# Patient Record
Sex: Female | Born: 1960 | Race: Black or African American | Hispanic: No | Smoking: Never smoker
Health system: Southern US, Community
[De-identification: ages and names within clinical notes are randomized; demographics above are authoritative.]

## PROBLEM LIST (undated history)

## (undated) DIAGNOSIS — E785 Hyperlipidemia, unspecified: Secondary | ICD-10-CM

## (undated) DIAGNOSIS — J45909 Unspecified asthma, uncomplicated: Secondary | ICD-10-CM

## (undated) DIAGNOSIS — E119 Type 2 diabetes mellitus without complications: Secondary | ICD-10-CM

## (undated) DIAGNOSIS — I10 Essential (primary) hypertension: Secondary | ICD-10-CM

## (undated) DIAGNOSIS — K759 Inflammatory liver disease, unspecified: Secondary | ICD-10-CM

## (undated) HISTORY — PX: DILATION AND CURETTAGE OF UTERUS: SHX78

---

## 1998-05-28 ENCOUNTER — Other Ambulatory Visit: Admission: RE | Admit: 1998-05-28 | Discharge: 1998-05-28 | Payer: Self-pay | Admitting: Obstetrics and Gynecology

## 2003-05-05 ENCOUNTER — Ambulatory Visit (HOSPITAL_COMMUNITY): Admission: RE | Admit: 2003-05-05 | Discharge: 2003-05-05 | Payer: Self-pay | Admitting: *Deleted

## 2004-09-13 ENCOUNTER — Ambulatory Visit: Payer: Self-pay | Admitting: Gastroenterology

## 2004-09-28 ENCOUNTER — Ambulatory Visit (HOSPITAL_COMMUNITY): Admission: RE | Admit: 2004-09-28 | Discharge: 2004-09-28 | Payer: Self-pay | Admitting: Gastroenterology

## 2004-10-14 ENCOUNTER — Ambulatory Visit: Payer: Self-pay | Admitting: Gastroenterology

## 2004-10-20 ENCOUNTER — Ambulatory Visit (HOSPITAL_COMMUNITY): Admission: RE | Admit: 2004-10-20 | Discharge: 2004-10-20 | Payer: Self-pay | Admitting: Gastroenterology

## 2004-10-20 ENCOUNTER — Encounter (INDEPENDENT_AMBULATORY_CARE_PROVIDER_SITE_OTHER): Payer: Self-pay | Admitting: Specialist

## 2004-12-02 ENCOUNTER — Ambulatory Visit: Payer: Self-pay | Admitting: Gastroenterology

## 2005-04-29 ENCOUNTER — Ambulatory Visit: Payer: Self-pay | Admitting: Internal Medicine

## 2006-05-12 ENCOUNTER — Ambulatory Visit: Payer: Self-pay | Admitting: Gastroenterology

## 2006-07-12 ENCOUNTER — Ambulatory Visit (HOSPITAL_COMMUNITY): Admission: RE | Admit: 2006-07-12 | Discharge: 2006-07-12 | Payer: Self-pay | Admitting: Gastroenterology

## 2007-06-21 ENCOUNTER — Ambulatory Visit: Payer: Self-pay | Admitting: Gastroenterology

## 2007-07-09 ENCOUNTER — Ambulatory Visit (HOSPITAL_COMMUNITY): Admission: RE | Admit: 2007-07-09 | Discharge: 2007-07-09 | Payer: Self-pay | Admitting: Gastroenterology

## 2008-01-24 ENCOUNTER — Ambulatory Visit: Payer: Self-pay | Admitting: Gastroenterology

## 2008-05-08 ENCOUNTER — Ambulatory Visit: Payer: Self-pay | Admitting: Gastroenterology

## 2008-10-07 ENCOUNTER — Ambulatory Visit: Payer: Self-pay | Admitting: Gastroenterology

## 2008-10-07 ENCOUNTER — Encounter: Payer: Self-pay | Admitting: Internal Medicine

## 2009-05-19 ENCOUNTER — Ambulatory Visit: Payer: Self-pay | Admitting: Gastroenterology

## 2009-06-10 ENCOUNTER — Encounter: Admission: RE | Admit: 2009-06-10 | Discharge: 2009-06-10 | Payer: Self-pay | Admitting: General Practice

## 2009-09-14 ENCOUNTER — Ambulatory Visit (HOSPITAL_COMMUNITY): Admission: RE | Admit: 2009-09-14 | Discharge: 2009-09-14 | Payer: Self-pay | Admitting: Gastroenterology

## 2009-12-31 ENCOUNTER — Ambulatory Visit: Payer: Self-pay | Admitting: Gastroenterology

## 2010-10-17 ENCOUNTER — Encounter: Payer: Self-pay | Admitting: Gastroenterology

## 2010-11-09 ENCOUNTER — Other Ambulatory Visit: Payer: Self-pay | Admitting: Gastroenterology

## 2010-11-09 DIAGNOSIS — B1920 Unspecified viral hepatitis C without hepatic coma: Secondary | ICD-10-CM

## 2010-11-15 ENCOUNTER — Other Ambulatory Visit (HOSPITAL_COMMUNITY): Payer: Self-pay

## 2010-11-25 ENCOUNTER — Other Ambulatory Visit: Payer: Self-pay | Admitting: Gastroenterology

## 2010-11-25 DIAGNOSIS — B1920 Unspecified viral hepatitis C without hepatic coma: Secondary | ICD-10-CM

## 2010-11-29 ENCOUNTER — Other Ambulatory Visit (HOSPITAL_COMMUNITY): Payer: Self-pay

## 2010-12-06 ENCOUNTER — Ambulatory Visit (HOSPITAL_COMMUNITY)
Admission: RE | Admit: 2010-12-06 | Discharge: 2010-12-06 | Disposition: A | Discharge status: Home / Self Care | Payer: Self-pay | Source: Ambulatory Visit | Attending: Gastroenterology | Admitting: Gastroenterology

## 2010-12-06 DIAGNOSIS — B1920 Unspecified viral hepatitis C without hepatic coma: Secondary | ICD-10-CM | POA: Insufficient documentation

## 2011-10-27 ENCOUNTER — Other Ambulatory Visit: Payer: Self-pay | Admitting: Gastroenterology

## 2011-12-30 ENCOUNTER — Other Ambulatory Visit: Payer: Self-pay | Admitting: Gastroenterology

## 2011-12-30 ENCOUNTER — Telehealth: Payer: Self-pay | Admitting: Gastroenterology

## 2011-12-30 DIAGNOSIS — B181 Chronic viral hepatitis B without delta-agent: Secondary | ICD-10-CM

## 2011-12-30 NOTE — Telephone Encounter (Signed)
Faxed renewal for entecavir 0.5 mg daily, 90, 4 with instructions to have the patient call the office for an appt.

## 2012-04-12 ENCOUNTER — Ambulatory Visit (INDEPENDENT_AMBULATORY_CARE_PROVIDER_SITE_OTHER): Payer: Self-pay | Admitting: Gastroenterology

## 2012-04-12 DIAGNOSIS — B181 Chronic viral hepatitis B without delta-agent: Secondary | ICD-10-CM

## 2012-04-12 DIAGNOSIS — K7460 Unspecified cirrhosis of liver: Secondary | ICD-10-CM

## 2012-04-12 LAB — HEPATIC FUNCTION PANEL
ALT: 19 U/L (ref 0–35)
Albumin: 4 g/dL (ref 3.5–5.2)
Total Protein: 7.4 g/dL (ref 6.0–8.3)

## 2012-04-13 LAB — HEPATITIS B SURFACE ANTIBODY,QUALITATIVE: Hep B S Ab: NEGATIVE

## 2012-04-13 LAB — HEPATITIS B SURFACE ANTIGEN: Hepatitis B Surface Ag: POSITIVE — AB

## 2012-04-16 LAB — HEPATITIS B DNA, ULTRAQUANTITATIVE, PCR

## 2012-04-19 NOTE — Progress Notes (Signed)
Evelyn Wolf, Evelyn Wolf    MR#:  [id number]      DATE:  04/12/2012  DOB:  1960/11/25    cc:     referring physician:  Laban Emperor. Cloward, MD, [facility], [address], [place], [place] [address], phone [phone].  REASON FOR VISIT: Follow up of E antibody positive, genotype unknown HBV.   HISTORY: The patient returns today unaccompanied, having not been seen since 12/31/2009. She indicates that she missed appointments because of a lack of insurance, but this has now been resolved.   She is a 51 year old woman who emigrated from [place] in 1994, who underwent a liver biopsy on 10/20/2004 demonstrating grade II stage IV disease. She was initiated on entecavir 0.5 mg daily on 10/29/2004 given her serologic findings as well as evidence of advanced fibrosis on her biopsy. She continues on entecavir since that time, having not missed a dose, though she was unable to attend appointments here because of lack of insurance. She indicated she was on the patient assistance program through [employer] that allowed her to get her medications. There are currently no symptoms referable to history of hepatitis B nor are there symptoms to suggest decompensated liver disease.   PAST MEDICAL HISTORY: Significant for hypertension.   CURRENT MEDICATIONS:  1. Triamterene/hydrochlorothiazide 37.5/25 mg daily.  2. Entecavir 0.5 mg daily.   ALLERGIES: Denies.   HABITS: Smoking:  Denies. Alcohol consumption:  Denies.   REVIEW OF SYSTEMS: All 10 systems reviewed today with the patient and they are negative other than which is mentioned above. She did not complete a CES-D.   PHYSICAL EXAMINATION: Constitutional:  Central obesity, but otherwise well. Vital signs: Height 65 inches, weight 267 pounds up from 258 on 12/31/2009, blood pressure 138/100, pulse of 88, temperature 98.9 Fahrenheit.   LABORATORY DATA: The last labs from 12/31/2009 showed that her platelet count was 186. INR was 1.15. Her ALT was 16  with an AST of 21, ALP 51, total bilirubin 0.8, albumin of 3.9. Her AST was 1.3. HBV DNA was detectable but not quantifiable. Her E antibody was positive, E antigen was negative.   ASSESSMENT: The patient is a 51 year old woman with a history of genotype unknown, E antibody positive hepatitis B with a biopsy on 10/20/2004 showing grade II stage IV disease from her HBV. She is suppressed on entecavir since 10/29/2004 or approximately 7 years and 5-1/2 months or 388 weeks of treatment.   In terms of her hepatitis B, we will need to assess her virologic response because it has been over 2 years since we had serologic testing on her. Because of the cirrhosis, however, my plan would be to continue on entecavir indefinitely.   In terms of cirrhosis care, there is no ascites or encephalopathy to treat. She does not need screening for varices provided she is not thrombocytopenic and there is no indication of this from her previous lab testing. She needs another ultrasound to update her HCC screening. She is previously hepatitis A immune.   In my discussion today with the patient, we discussed the nature of hepatitis B and the importance of followup and the testing I am going to perform today.   PLAN:  1. Liver enzymes, hepatitis B surface antigen antibody, E antigen, E antibody, HBV DNA, and AFP.  2. Ultrasound next available.  3. Hepatitis A immune.  4. Return in approximately 6 months' time in followup at which time another ultrasound will be due.  5. Continue on the entecavir for  the indefinite future.               Brooke Dare, MD   ADDENDUM No detectable level of HBV DNA.   [id number] .[id number]  D:  Thu Jul 18 20:40:42 2013 ; T:  Fri Jul 19 10:10:05 2013  Job #:  [id number]

## 2012-05-11 ENCOUNTER — Ambulatory Visit (HOSPITAL_COMMUNITY)
Admission: RE | Admit: 2012-05-11 | Discharge: 2012-05-11 | Disposition: A | Discharge status: Home / Self Care | Payer: Self-pay | Source: Ambulatory Visit | Attending: Gastroenterology | Admitting: Gastroenterology

## 2012-05-11 DIAGNOSIS — B181 Chronic viral hepatitis B without delta-agent: Secondary | ICD-10-CM | POA: Insufficient documentation

## 2012-05-11 DIAGNOSIS — K7460 Unspecified cirrhosis of liver: Secondary | ICD-10-CM

## 2012-05-17 ENCOUNTER — Encounter: Payer: Self-pay | Admitting: Gastroenterology

## 2013-02-28 ENCOUNTER — Other Ambulatory Visit: Payer: Self-pay | Admitting: Internal Medicine

## 2013-02-28 DIAGNOSIS — B1920 Unspecified viral hepatitis C without hepatic coma: Secondary | ICD-10-CM

## 2013-03-06 ENCOUNTER — Ambulatory Visit
Admission: RE | Admit: 2013-03-06 | Discharge: 2013-03-06 | Disposition: A | Discharge status: Home / Self Care | Payer: BC Managed Care – PPO | Source: Ambulatory Visit | Attending: Internal Medicine | Admitting: Internal Medicine

## 2013-03-06 DIAGNOSIS — B1920 Unspecified viral hepatitis C without hepatic coma: Secondary | ICD-10-CM

## 2013-11-20 ENCOUNTER — Other Ambulatory Visit: Payer: Self-pay | Admitting: Obstetrics and Gynecology

## 2013-11-20 DIAGNOSIS — Z1231 Encounter for screening mammogram for malignant neoplasm of breast: Secondary | ICD-10-CM

## 2013-12-02 ENCOUNTER — Ambulatory Visit
Admission: RE | Admit: 2013-12-02 | Discharge: 2013-12-02 | Disposition: A | Discharge status: Home / Self Care | Payer: BC Managed Care – PPO | Source: Ambulatory Visit | Attending: Obstetrics and Gynecology | Admitting: Obstetrics and Gynecology

## 2013-12-02 DIAGNOSIS — Z1231 Encounter for screening mammogram for malignant neoplasm of breast: Secondary | ICD-10-CM

## 2013-12-03 ENCOUNTER — Other Ambulatory Visit: Payer: Self-pay | Admitting: Obstetrics and Gynecology

## 2013-12-03 DIAGNOSIS — R928 Other abnormal and inconclusive findings on diagnostic imaging of breast: Secondary | ICD-10-CM

## 2013-12-06 ENCOUNTER — Ambulatory Visit
Admission: RE | Admit: 2013-12-06 | Discharge: 2013-12-06 | Disposition: A | Discharge status: Home / Self Care | Payer: BC Managed Care – PPO | Source: Ambulatory Visit | Attending: Obstetrics and Gynecology | Admitting: Obstetrics and Gynecology

## 2013-12-06 ENCOUNTER — Ambulatory Visit: Payer: BC Managed Care – PPO

## 2013-12-06 ENCOUNTER — Other Ambulatory Visit: Payer: Self-pay | Admitting: Obstetrics and Gynecology

## 2013-12-06 DIAGNOSIS — R928 Other abnormal and inconclusive findings on diagnostic imaging of breast: Secondary | ICD-10-CM

## 2013-12-09 ENCOUNTER — Other Ambulatory Visit: Payer: BC Managed Care – PPO

## 2013-12-10 ENCOUNTER — Encounter: Payer: Self-pay | Admitting: Gynecology

## 2013-12-11 ENCOUNTER — Other Ambulatory Visit: Payer: Self-pay | Admitting: Nurse Practitioner

## 2013-12-11 DIAGNOSIS — C220 Liver cell carcinoma: Secondary | ICD-10-CM

## 2013-12-13 ENCOUNTER — Other Ambulatory Visit: Payer: BC Managed Care – PPO

## 2014-01-06 ENCOUNTER — Other Ambulatory Visit: Payer: BC Managed Care – PPO

## 2014-07-04 ENCOUNTER — Ambulatory Visit
Admission: RE | Admit: 2014-07-04 | Discharge: 2014-07-04 | Disposition: A | Discharge status: Home / Self Care | Payer: BC Managed Care – PPO | Source: Ambulatory Visit | Attending: Nurse Practitioner | Admitting: Nurse Practitioner

## 2014-07-04 DIAGNOSIS — C220 Liver cell carcinoma: Secondary | ICD-10-CM

## 2014-11-20 ENCOUNTER — Other Ambulatory Visit: Payer: Self-pay | Admitting: Obstetrics and Gynecology

## 2014-11-20 DIAGNOSIS — Z1231 Encounter for screening mammogram for malignant neoplasm of breast: Secondary | ICD-10-CM

## 2014-12-04 ENCOUNTER — Ambulatory Visit
Admission: RE | Admit: 2014-12-04 | Discharge: 2014-12-04 | Disposition: A | Discharge status: Home / Self Care | Payer: BLUE CROSS/BLUE SHIELD | Source: Ambulatory Visit | Attending: Obstetrics and Gynecology | Admitting: Obstetrics and Gynecology

## 2014-12-04 ENCOUNTER — Encounter (INDEPENDENT_AMBULATORY_CARE_PROVIDER_SITE_OTHER): Payer: Self-pay

## 2014-12-04 DIAGNOSIS — Z1231 Encounter for screening mammogram for malignant neoplasm of breast: Secondary | ICD-10-CM

## 2014-12-22 ENCOUNTER — Other Ambulatory Visit (HOSPITAL_COMMUNITY): Payer: Self-pay | Admitting: Nurse Practitioner

## 2014-12-22 DIAGNOSIS — B181 Chronic viral hepatitis B without delta-agent: Secondary | ICD-10-CM

## 2015-02-24 ENCOUNTER — Telehealth (HOSPITAL_COMMUNITY): Payer: Self-pay

## 2015-02-24 NOTE — Telephone Encounter (Signed)
Called to remind pt of 8:00am appt at [facility] on Wednesday 02/25/15. Pt agreed to stay npo for exam. AW

## 2015-02-25 ENCOUNTER — Ambulatory Visit (HOSPITAL_COMMUNITY)
Admission: RE | Admit: 2015-02-25 | Discharge: 2015-02-25 | Disposition: A | Discharge status: Home / Self Care | Payer: BLUE CROSS/BLUE SHIELD | Source: Ambulatory Visit | Attending: Nurse Practitioner | Admitting: Nurse Practitioner

## 2015-02-25 DIAGNOSIS — B181 Chronic viral hepatitis B without delta-agent: Secondary | ICD-10-CM | POA: Insufficient documentation

## 2015-12-28 ENCOUNTER — Other Ambulatory Visit: Payer: Self-pay | Admitting: Nurse Practitioner

## 2015-12-29 ENCOUNTER — Other Ambulatory Visit: Payer: Self-pay | Admitting: Nurse Practitioner

## 2015-12-30 ENCOUNTER — Other Ambulatory Visit: Payer: Self-pay | Admitting: Nurse Practitioner

## 2015-12-30 DIAGNOSIS — B181 Chronic viral hepatitis B without delta-agent: Secondary | ICD-10-CM

## 2016-01-07 ENCOUNTER — Other Ambulatory Visit: Payer: BLUE CROSS/BLUE SHIELD

## 2016-01-12 ENCOUNTER — Other Ambulatory Visit: Payer: BLUE CROSS/BLUE SHIELD

## 2016-01-14 ENCOUNTER — Encounter (HOSPITAL_COMMUNITY): Payer: Self-pay | Admitting: *Deleted

## 2016-01-15 ENCOUNTER — Other Ambulatory Visit: Payer: Self-pay | Admitting: Obstetrics and Gynecology

## 2016-02-04 NOTE — Patient Instructions (Addendum)
Your procedure is scheduled on: Friday, 5/19  Enter through the Main Entrance of [facility] at: 1 pm  Pick up the phone at the desk and dial [phone].  Call this number if you have problems the morning of surgery: [phone].  Remember:  Do NOT eat food: After midnight Thursday, 5/18  Do NOT drink clear liquids After 10am Friday, 5/1  Take these medicines the morning of surgery with a SIP OF WATER:  Atorvastatin, losartan, triamterene-hydrochlorothiazide. Baraclude.  Do not take your metformin Friday morning (5/19).  We will check you blood surgar upon arrival to Short Stay department and treat if needed.  Do NOT wear jewelry (body piercing), metal hair clips/bobby pins, make-up, or nail polish. Do NOT wear lotions, powders, or perfumes.  You may wear deoderant. Do NOT shave for 48 hours prior to surgery. Do NOT bring valuables to the hospital.  Have a responsible adult drive you home and stay with you for 24 hours after your procedure.  Home with daughter Rise Paganini cell [phone].

## 2016-02-05 ENCOUNTER — Encounter (HOSPITAL_COMMUNITY): Payer: Self-pay

## 2016-02-05 ENCOUNTER — Encounter (HOSPITAL_COMMUNITY)
Admission: RE | Admit: 2016-02-05 | Discharge: 2016-02-05 | Disposition: A | Discharge status: Home / Self Care | Payer: BLUE CROSS/BLUE SHIELD | Source: Ambulatory Visit | Attending: Obstetrics and Gynecology | Admitting: Obstetrics and Gynecology

## 2016-02-05 ENCOUNTER — Other Ambulatory Visit: Payer: Self-pay

## 2016-02-05 DIAGNOSIS — E119 Type 2 diabetes mellitus without complications: Secondary | ICD-10-CM | POA: Diagnosis not present

## 2016-02-05 DIAGNOSIS — E785 Hyperlipidemia, unspecified: Secondary | ICD-10-CM | POA: Insufficient documentation

## 2016-02-05 DIAGNOSIS — Z01812 Encounter for preprocedural laboratory examination: Secondary | ICD-10-CM | POA: Diagnosis present

## 2016-02-05 DIAGNOSIS — N959 Unspecified menopausal and perimenopausal disorder: Secondary | ICD-10-CM | POA: Diagnosis not present

## 2016-02-05 DIAGNOSIS — J45909 Unspecified asthma, uncomplicated: Secondary | ICD-10-CM | POA: Insufficient documentation

## 2016-02-05 DIAGNOSIS — I10 Essential (primary) hypertension: Secondary | ICD-10-CM | POA: Diagnosis not present

## 2016-02-05 HISTORY — DX: Type 2 diabetes mellitus without complications: E11.9

## 2016-02-05 HISTORY — DX: Hyperlipidemia, unspecified: E78.5

## 2016-02-05 LAB — BASIC METABOLIC PANEL
Anion gap: 10 (ref 5–15)
BUN: 12 mg/dL (ref 6–20)
CO2: 28 mmol/L (ref 22–32)
CREATININE: 1.23 mg/dL — AB (ref 0.44–1.00)
Calcium: 9.6 mg/dL (ref 8.9–10.3)
Chloride: 104 mmol/L (ref 101–111)
GFR calc non Af Amer: 48 mL/min — ABNORMAL LOW (ref >60)
GFR, EST AFRICAN AMERICAN: 56 mL/min — AB (ref >60)
Glucose, Bld: 103 mg/dL — ABNORMAL HIGH (ref 65–99)
Potassium: 3.1 mmol/L — ABNORMAL LOW (ref 3.5–5.1)
SODIUM: 142 mmol/L (ref 135–145)

## 2016-02-05 LAB — CBC
HCT: 36 % (ref 36.0–46.0)
Hemoglobin: 12.3 g/dL (ref 12.0–15.0)
MCH: 28.3 pg (ref 26.0–34.0)
MCHC: 34.2 g/dL (ref 30.0–36.0)
MCV: 82.9 fL (ref 78.0–100.0)
Platelets: 151 10*3/uL (ref 150–400)
RBC: 4.34 MIL/uL (ref 3.87–5.11)
RDW: 14.1 % (ref 11.5–15.5)
WBC: 6.7 10*3/uL (ref 4.0–10.5)

## 2016-02-12 ENCOUNTER — Encounter (HOSPITAL_COMMUNITY): Payer: Self-pay | Admitting: Obstetrics and Gynecology

## 2016-02-12 ENCOUNTER — Ambulatory Visit (HOSPITAL_COMMUNITY): Payer: BLUE CROSS/BLUE SHIELD | Admitting: Anesthesiology

## 2016-02-12 ENCOUNTER — Encounter (HOSPITAL_COMMUNITY)
Admission: RE | Disposition: A | Discharge status: Home / Self Care | Payer: Self-pay | Source: Ambulatory Visit | Attending: Obstetrics and Gynecology

## 2016-02-12 ENCOUNTER — Ambulatory Visit (HOSPITAL_COMMUNITY)
Admission: RE | Admit: 2016-02-12 | Discharge: 2016-02-12 | Disposition: A | Discharge status: Home / Self Care | Payer: BLUE CROSS/BLUE SHIELD | Source: Ambulatory Visit | Attending: Obstetrics and Gynecology | Admitting: Obstetrics and Gynecology

## 2016-02-12 DIAGNOSIS — N840 Polyp of corpus uteri: Secondary | ICD-10-CM | POA: Diagnosis not present

## 2016-02-12 DIAGNOSIS — J45909 Unspecified asthma, uncomplicated: Secondary | ICD-10-CM | POA: Diagnosis not present

## 2016-02-12 DIAGNOSIS — E119 Type 2 diabetes mellitus without complications: Secondary | ICD-10-CM | POA: Diagnosis not present

## 2016-02-12 DIAGNOSIS — E785 Hyperlipidemia, unspecified: Secondary | ICD-10-CM | POA: Diagnosis not present

## 2016-02-12 DIAGNOSIS — I10 Essential (primary) hypertension: Secondary | ICD-10-CM | POA: Insufficient documentation

## 2016-02-12 DIAGNOSIS — R938 Abnormal findings on diagnostic imaging of other specified body structures: Secondary | ICD-10-CM | POA: Insufficient documentation

## 2016-02-12 DIAGNOSIS — B181 Chronic viral hepatitis B without delta-agent: Secondary | ICD-10-CM | POA: Insufficient documentation

## 2016-02-12 DIAGNOSIS — N950 Postmenopausal bleeding: Secondary | ICD-10-CM | POA: Diagnosis present

## 2016-02-12 DIAGNOSIS — Z7984 Long term (current) use of oral hypoglycemic drugs: Secondary | ICD-10-CM | POA: Diagnosis not present

## 2016-02-12 HISTORY — DX: Essential (primary) hypertension: I10

## 2016-02-12 HISTORY — PX: DILATATION & CURRETTAGE/HYSTEROSCOPY WITH RESECTOCOPE: SHX5572

## 2016-02-12 HISTORY — DX: Inflammatory liver disease, unspecified: K75.9

## 2016-02-12 HISTORY — DX: Unspecified asthma, uncomplicated: J45.909

## 2016-02-12 LAB — GLUCOSE, CAPILLARY
GLUCOSE-CAPILLARY: 77 mg/dL (ref 65–99)
GLUCOSE-CAPILLARY: 81 mg/dL (ref 65–99)
Glucose-Capillary: 80 mg/dL (ref 65–99)

## 2016-02-12 SURGERY — DILATATION & CURETTAGE/HYSTEROSCOPY WITH RESECTOCOPE
Anesthesia: General | Site: Vagina

## 2016-02-12 MED ORDER — FENTANYL CITRATE (PF) 100 MCG/2ML IJ SOLN: 25.0000 ug | INTRAMUSCULAR | Status: DC | PRN

## 2016-02-12 MED ORDER — PROPOFOL 10 MG/ML IV BOLUS
INTRAVENOUS | Status: DC | PRN
  Administered 2016-02-12: 200 mg via INTRAVENOUS

## 2016-02-12 MED ORDER — LACTATED RINGERS IV SOLN
INTRAVENOUS | Status: DC
  Administered 2016-02-12 (×3): via INTRAVENOUS

## 2016-02-12 MED ORDER — MIDAZOLAM HCL 2 MG/2ML IJ SOLN
INTRAMUSCULAR | Status: DC | PRN
  Administered 2016-02-12: 2 mg via INTRAVENOUS

## 2016-02-12 MED ORDER — ONDANSETRON HCL 4 MG/2ML IJ SOLN
INTRAMUSCULAR | Status: DC | PRN
  Administered 2016-02-12: 4 mg via INTRAVENOUS

## 2016-02-12 MED ORDER — SODIUM CHLORIDE 0.9 % IR SOLN
Status: DC | PRN
  Administered 2016-02-12: 3000 mL

## 2016-02-12 MED ORDER — LIDOCAINE HCL (CARDIAC) 20 MG/ML IV SOLN
INTRAVENOUS | Status: AC
  Filled 2016-02-12: qty 5

## 2016-02-12 MED ORDER — LIDOCAINE HCL (CARDIAC) 20 MG/ML IV SOLN
INTRAVENOUS | Status: DC | PRN
Start: 2016-02-12 — End: 2016-02-12
  Administered 2016-02-12: 60 mg via INTRAVENOUS

## 2016-02-12 MED ORDER — KETOROLAC TROMETHAMINE 30 MG/ML IJ SOLN
INTRAMUSCULAR | Status: AC
  Filled 2016-02-12: qty 1

## 2016-02-12 MED ORDER — KETOROLAC TROMETHAMINE 30 MG/ML IJ SOLN
INTRAMUSCULAR | Status: DC | PRN
Start: 2016-02-12 — End: 2016-02-12
  Administered 2016-02-12: 15 mg via INTRAVENOUS
  Administered 2016-02-12: 15 mg via INTRAMUSCULAR

## 2016-02-12 MED ORDER — SCOPOLAMINE 1 MG/3DAYS TD PT72: 1.0000 | MEDICATED_PATCH | Freq: Once | TRANSDERMAL | Status: DC

## 2016-02-12 MED ORDER — PROPOFOL 10 MG/ML IV BOLUS
INTRAVENOUS | Status: AC
  Filled 2016-02-12: qty 20

## 2016-02-12 MED ORDER — FENTANYL CITRATE (PF) 100 MCG/2ML IJ SOLN
INTRAMUSCULAR | Status: AC
  Filled 2016-02-12: qty 2

## 2016-02-12 MED ORDER — DEXAMETHASONE SODIUM PHOSPHATE 10 MG/ML IJ SOLN
INTRAMUSCULAR | Status: DC | PRN
  Administered 2016-02-12: 4 mg via INTRAVENOUS

## 2016-02-12 MED ORDER — MIDAZOLAM HCL 2 MG/2ML IJ SOLN
INTRAMUSCULAR | Status: AC
  Filled 2016-02-12: qty 2

## 2016-02-12 MED ORDER — LIDOCAINE HCL 2 % IJ SOLN
INTRAMUSCULAR | Status: AC
  Filled 2016-02-12: qty 20

## 2016-02-12 MED ORDER — LIDOCAINE HCL 2 % IJ SOLN
INTRAMUSCULAR | Status: DC | PRN
  Administered 2016-02-12: 10 mL

## 2016-02-12 MED ORDER — ONDANSETRON HCL 4 MG/2ML IJ SOLN
INTRAMUSCULAR | Status: AC
  Filled 2016-02-12: qty 2

## 2016-02-12 MED ORDER — MEPERIDINE HCL 25 MG/ML IJ SOLN: 6.2500 mg | INTRAMUSCULAR | Status: DC | PRN

## 2016-02-12 MED ORDER — FENTANYL CITRATE (PF) 100 MCG/2ML IJ SOLN
INTRAMUSCULAR | Status: DC | PRN
  Administered 2016-02-12 (×2): 100 ug via INTRAVENOUS

## 2016-02-12 MED ORDER — DEXAMETHASONE SODIUM PHOSPHATE 4 MG/ML IJ SOLN
INTRAMUSCULAR | Status: AC
  Filled 2016-02-12: qty 1

## 2016-02-12 MED ORDER — IBUPROFEN 600 MG PO TABS: ORAL_TABLET | ORAL | Status: DC

## 2016-02-12 MED ORDER — LACTATED RINGERS IV SOLN: INTRAVENOUS | Status: DC

## 2016-02-12 MED ORDER — PROCHLORPERAZINE EDISYLATE 5 MG/ML IJ SOLN: 10.0000 mg | INTRAMUSCULAR | Status: DC | PRN

## 2016-02-12 SURGICAL SUPPLY — 19 items
BOOTIES KNEE HIGH SLOAN (MISCELLANEOUS) ×3 IMPLANT
CANISTER SUCT 3000ML (MISCELLANEOUS) ×3 IMPLANT
CATH ROBINSON RED A/P 16FR (CATHETERS) ×3 IMPLANT
CLOTH BEACON ORANGE TIMEOUT ST (SAFETY) ×3 IMPLANT
CONTAINER PREFILL 10% NBF 60ML (FORM) ×4 IMPLANT
DILATOR CANAL MILEX (MISCELLANEOUS) IMPLANT
ELECT REM PT RETURN 9FT ADLT (ELECTROSURGICAL)
ELECTRODE REM PT RTRN 9FT ADLT (ELECTROSURGICAL) ×1 IMPLANT
GLOVE BIOGEL PI IND STRL 7.0 (GLOVE) ×1 IMPLANT
GLOVE BIOGEL PI INDICATOR 7.0 (GLOVE) ×2
GLOVE SURG SS PI 6.5 STRL IVOR (GLOVE) ×6 IMPLANT
GOWN STRL REUS W/TWL LRG LVL3 (GOWN DISPOSABLE) ×6 IMPLANT
LOOP ANGLED CUTTING 22FR (CUTTING LOOP) IMPLANT
PACK VAGINAL MINOR WOMEN LF (CUSTOM PROCEDURE TRAY) ×3 IMPLANT
PAD OB MATERNITY 4.3X12.25 (PERSONAL CARE ITEMS) ×3 IMPLANT
TOWEL OR 17X24 6PK STRL BLUE (TOWEL DISPOSABLE) ×6 IMPLANT
TUBING AQUILEX INFLOW (TUBING) ×3 IMPLANT
TUBING AQUILEX OUTFLOW (TUBING) ×3 IMPLANT
WATER STERILE IRR 1000ML POUR (IV SOLUTION) ×3 IMPLANT

## 2016-02-12 NOTE — Op Note (Signed)
02/12/2016  4:08 PM  PATIENT:  Evelyn Wolf  55 y.o. female  PRE-OPERATIVE DIAGNOSIS:  Abnormal postmenopausal Uterine Bleeding  POST-OPERATIVE DIAGNOSIS:  Abnormal postmenopausal Uterine Bleeding  PROCEDURE:  Procedure(s): DILATATION & CURETTAGE/HYSTEROSCOPY, POLYPECTOMY  SURGEON:  Maisie Hauser P, MD  ASSISTANTS: None  ANESTHESIA:   general and paracervical block  ESTIMATED BLOOD LOSS: 10 cc   BLOOD ADMINISTERED:none  COMPLICATIONS: None  DISTENSION MEDIUM:  Normal saline  FINDINGS: The uterus sounded to 8 cm. At the time of hysteroscopy and endometrial polyp originating at the left sidewall of the cavity was visualized and essentially filled cavity. The tubal ostia were visualized. The remainder of the endometrium was atrophic.  FLUID DEFICIT: 50 cc  LOCAL MEDICATIONS USED:  LIDOCAINE  and Amount: 10 ml  SPECIMEN:  Source of Specimen:  Endometrial polyp and curettings  DISPOSITION OF SPECIMEN:  PATHOLOGY  COUNTS:  YES  DESCRIPTION OF PROCEDURE:the patient was taken to the operating room after appropriate identification and placed on the operating table. After the attainment of adequate general anesthesia she was placed in the lithotomy position.  A timeout was performed. The perineum and vagina were prepped with multiple layers of Betadine. The bladder was emptied with a an in and out catheter. The perineum was draped in sterile field. A gray speculum was placed in the vagina. The cervix was grasped with a single-tooth tenaculum. A paracervical block was achieved with a total of 10 cc of 2% Xylocaine and the 5 and 7:00 positions. The uterus was sounded.  The cervix was then dilated to accommodate the diagnostic hysteroscope. The hysteroscope was used to evaluate all quadrants of the uterus with the above findings. Hysteroscopic scissors were used to cut across the base of the polyp and the tissue was removed with a curette. The remainder of the endometrial cavity was  curetted in all quadrants. The hysteroscope was again placed in the endometrial cavity and documentation of removal of the polyp was noted. All instruments were then removed from the vagina and the patient was awakened from general anesthesia and taken to the recovery room in satisfactory condition having tolerated the procedure well sponge and instrument counts correct.  PLAN OF CARE: Discharge home after postanesthesia care  PATIENT DISPOSITION:  PACU - hemodynamically stable.   Delay start of Pharmacological VTE agent (>24hrs) due to surgical blood loss or risk of bleeding:  Yes.  SCDs used during the procedure.   Eldred Manges, MD 4:08 PM

## 2016-02-12 NOTE — Discharge Instructions (Signed)
DISCHARGE INSTRUCTIONS: D&C / D&E The following instructions have been prepared to help you care for yourself upon your return home.   Personal hygiene:  Use sanitary pads for vaginal drainage, not tampons.  Shower the day after your procedure.  NO tub baths, pools or Jacuzzis for 2-3 weeks.  Wipe front to back after using the bathroom.  Activity and limitations:  Do NOT drive or operate any equipment for 24 hours. The effects of anesthesia are still present and drowsiness may result.  Do NOT rest in bed all day.  Walking is encouraged.  Walk up and down stairs slowly.  You may resume your normal activity in one to two days or as indicated by your physician.  Sexual activity: NO intercourse for at least 2 weeks after the procedure, or as indicated by your physician.  Diet: Eat a light meal as desired this evening. You may resume your usual diet tomorrow.  Return to work: You may resume your work activities in one to two days or as indicated by your doctor.  What to expect after your surgery: Expect to have vaginal bleeding/discharge for 2-3 days and spotting for up to 10 days. It is not unusual to have soreness for up to 1-2 weeks. You may have a slight burning sensation when you urinate for the first day. Mild cramps may continue for a couple of days. You may have a regular period in 2-6 weeks.  Call your doctor for any of the following:  Excessive vaginal bleeding, saturating and changing one pad every hour.  Inability to urinate 6 hours after discharge from hospital.  Pain not relieved by pain medication.  Fever of 100.4 F or greater.  Unusual vaginal discharge or odor.   Call for an appointment:    Patients signature: ______________________  Nurses signature ________________________  Support person's signature_______________________  May have  Ibuprofen/advil/motrin  Starting  at  [facility]. Increase water intake next 48hours. Heating pad to abdomen if  needed.

## 2016-02-12 NOTE — Anesthesia Postprocedure Evaluation (Signed)
Anesthesia Post Note  Patient: Evelyn Wolf  Procedure(s) Performed: Procedure(s) (LRB): DILATATION & CURETTAGE/HYSTEROSCOPY, POLYPECTOMY (N/A)  Patient location during evaluation: PACU Anesthesia Type: General Level of consciousness: awake and alert Pain management: pain level controlled Vital Signs Assessment: post-procedure vital signs reviewed and stable Respiratory status: spontaneous breathing, nonlabored ventilation, respiratory function stable and patient connected to nasal cannula oxygen Cardiovascular status: blood pressure returned to baseline and stable Postop Assessment: no signs of nausea or vomiting Anesthetic complications: no     Last Vitals:  Filed Vitals:   02/12/16 1607 02/12/16 1630  BP: 130/73 132/73  Pulse: 79 78  Temp: 36.7 C 36.7 C  Resp: 17 18    Last Pain:  Filed Vitals:   02/12/16 1643  PainSc: 0-No pain   Pain Goal:                 Effie Berkshire

## 2016-02-12 NOTE — Transfer of Care (Signed)
Immediate Anesthesia Transfer of Care Note  Patient: Evelyn Wolf  Procedure(s) Performed: Procedure(s): DILATATION & CURETTAGE/HYSTEROSCOPY, POLYPECTOMY (N/A)  Patient Location: PACU  Anesthesia Type:General  Level of Consciousness: sedated  Airway & Oxygen Therapy: Patient Spontanous Breathing and Patient connected to nasal cannula oxygen  Post-op Assessment: Report given to RN and Post -op Vital signs reviewed and stable  Post vital signs: Reviewed and stable  Last Vitals: There were no vitals filed for this visit.  Last Pain: There were no vitals filed for this visit.       Complications: No apparent anesthesia complications

## 2016-02-12 NOTE — Anesthesia Procedure Notes (Signed)
Procedure Name: LMA Insertion Date/Time: 02/12/2016 2:27 PM Performed by: Jonna Munro Pre-anesthesia Checklist: Patient identified, Emergency Drugs available, Suction available, Patient being monitored and Timeout performed Patient Re-evaluated:Patient Re-evaluated prior to inductionOxygen Delivery Method: Circle system utilized Preoxygenation: Pre-oxygenation with 100% oxygen Intubation Type: IV induction LMA: LMA inserted LMA Size: 4.0 Number of attempts: 1 Dental Injury: Teeth and Oropharynx as per pre-operative assessment

## 2016-02-12 NOTE — Anesthesia Preprocedure Evaluation (Addendum)
Anesthesia Evaluation  Patient identified by MRN, date of birth, ID band Patient awake    Reviewed: Allergy & Precautions, NPO status , Patient's Chart, lab work & pertinent test results  Airway Mallampati: III  TM Distance: >3 FB Neck ROM: Full    Dental  (+) Teeth Intact, Dental Advisory Given   Pulmonary neg pulmonary ROS,    breath sounds clear to auscultation       Cardiovascular hypertension, Pt. on medications  Rhythm:Regular Rate:Normal     Neuro/Psych negative neurological ROS  negative psych ROS   GI/Hepatic negative GI ROS, (+) Hepatitis -, B, C  Endo/Other  diabetes, Type 2, Oral Hypoglycemic Agents  Renal/GU negative Renal ROS  negative genitourinary   Musculoskeletal negative musculoskeletal ROS (+)   Abdominal (+) + obese,  Abdomen: soft.    Peds negative pediatric ROS (+)  Hematology negative hematology ROS (+)   Anesthesia Other Findings   Reproductive/Obstetrics negative OB ROS                            Lab Results  Component Value Date   WBC 6.7 02/05/2016   HGB 12.3 02/05/2016   HCT 36.0 02/05/2016   MCV 82.9 02/05/2016   PLT 151 02/05/2016   Lab Results  Component Value Date   CREATININE 1.23* 02/05/2016   BUN 12 02/05/2016   NA 142 02/05/2016   K 3.1* 02/05/2016   CL 104 02/05/2016   CO2 28 02/05/2016    01/2016 EKG: normal EKG, normal sinus rhythm.   Anesthesia Physical Anesthesia Plan  ASA: III  Anesthesia Plan: General   Post-op Pain Management:    Induction: Intravenous  Airway Management Planned: LMA  Additional Equipment:   Intra-op Plan:   Post-operative Plan: Extubation in OR  Informed Consent: I have reviewed the patients History and Physical, chart, labs and discussed the procedure including the risks, benefits and alternatives for the proposed anesthesia with the patient or authorized representative who has indicated his/her  understanding and acceptance.   Dental advisory given  Plan Discussed with: CRNA  Anesthesia Plan Comments:         Anesthesia Quick Evaluation

## 2016-02-12 NOTE — H&P (Addendum)
Evelyn Wolf is an 55 y.o. female. With single episode of postmenopausal bleeding and thickened endometrium on ultrasound   Pertinent Gynecological History: Menstrual History:  Patient is currently postmenopausal having had no menses since September 2015. She also had serum FSH and estradiol consistent with menopause.     Past Medical History  Diagnosis Date  . Hepatitis     Hep B   . Hypertension   . Hyperlipidemia   . Diabetes mellitus without complication ([place])     type 2  . Asthma in adult     Past Surgical History  Procedure Laterality Date  . Cesarean section  1994  . Dilation and curettage of uterus      Family History  Problem Relation Age of Onset  . Hypertension Mother     Social History:  reports that she has never smoked. She has never used smokeless tobacco. She reports that she does not drink alcohol or use illicit drugs.  Allergies: No Known Allergies  Prescriptions prior to admission  Medication Sig Dispense Refill Last Dose  . atorvastatin (LIPITOR) 40 MG tablet Take 40 mg by mouth daily.   02/12/2016 at 0700  . BARACLUDE 0.5 MG tablet Take 0.5 mg by mouth daily.    02/12/2016 at 0700  . losartan (COZAAR) 100 MG tablet Take 100 mg by mouth daily.   02/12/2016 at 0700  . metFORMIN (GLUCOPHAGE-XR) 500 MG 24 hr tablet Take 500 mg by mouth daily.   02/11/2016 at 0800  . naproxen sodium (ALEVE) 220 MG tablet Take 440 mg by mouth 2 (two) times daily as needed.   Past Month at Unknown time  . triamterene-hydrochlorothiazide (MAXZIDE-25) 37.5-25 MG tablet Take 1 tablet by mouth daily.   02/12/2016 at 0700    Review of Systems  Constitutional: Negative.   HENT: Negative.   Eyes: Negative.   Respiratory: Negative.   Cardiovascular: Negative.   Gastrointestinal: Negative.   Genitourinary: Negative.   Musculoskeletal: Negative.   Skin: Negative.   Neurological: Negative.   Endo/Heme/Allergies: Negative.   Psychiatric/Behavioral: Negative.     Blood pressure  130/73, pulse 79, temperature 98 F (36.7 C), resp. rate 17, SpO2 96 %. Physical Exam  Constitutional: She is oriented to person, place, and time. She appears well-developed and well-nourished.  HENT:  Head: Normocephalic and atraumatic.  Eyes: EOM are normal.  Neck: Normal range of motion. Neck supple.  Cardiovascular: Normal rate and regular rhythm.   Respiratory: Effort normal and breath sounds normal.  GI: Soft. Bowel sounds are normal.  Genitourinary:  Pelvic exam: VULVA: normal appearing vulva with no masses, tenderness or lesions,  VAGINA: normal appearing vagina with normal color and discharge, no lesions,  CERVIX: normal appearing cervix without discharge or lesions,  UTERUS: Size is difficult to discern because of patient's body habitus,  ADNEXA: normal adnexa in size, nontender and no masses.  Musculoskeletal: Normal range of motion.  Neurological: She is alert and oriented to person, place, and time.  Skin: Skin is warm and dry.  Psychiatric: She has a normal mood and affect.    Results for orders placed or performed during the hospital encounter of 02/12/16 (from the past 24 hour(s))  Glucose, capillary     Status: None   Collection Time: 02/12/16 12:08 PM  Result Value Ref Range   Glucose-Capillary 77 65 - 99 mg/dL  Glucose, capillary     Status: None   Collection Time: 02/12/16  1:34 PM  Result Value Ref Range   Glucose-Capillary  80 65 - 99 mg/dL  Glucose, capillary     Status: None   Collection Time: 02/12/16  3:34 PM  Result Value Ref Range   Glucose-Capillary 81 65 - 99 mg/dL     Assessment/Plan: Post menopausal bleeding with thickened endometrium on office ultrasound.  Hysteroscopy d&c and possible resection recommended and accepted.  Risks and benefits reviewed.  Pt wants to proceed.  Charrie Mcconnon P 02/12/2016, 4:23 PM

## 2016-02-15 ENCOUNTER — Encounter (HOSPITAL_COMMUNITY): Payer: Self-pay | Admitting: Obstetrics and Gynecology

## 2016-03-07 ENCOUNTER — Other Ambulatory Visit: Payer: BLUE CROSS/BLUE SHIELD

## 2016-04-20 ENCOUNTER — Other Ambulatory Visit: Payer: BLUE CROSS/BLUE SHIELD

## 2016-07-13 ENCOUNTER — Ambulatory Visit
Admission: RE | Admit: 2016-07-13 | Discharge: 2016-07-13 | Disposition: A | Discharge status: Home / Self Care | Payer: BLUE CROSS/BLUE SHIELD | Source: Ambulatory Visit | Attending: Nurse Practitioner | Admitting: Nurse Practitioner

## 2016-07-13 DIAGNOSIS — B181 Chronic viral hepatitis B without delta-agent: Secondary | ICD-10-CM

## 2016-11-05 IMAGING — US US ABDOMEN COMPLETE W/ ELASTOGRAPHY
1 series · 13 of 25 positions shown · non-contrast
Comparison: Abdominal ultrasound 07/04/2014

CLINICAL DATA: Chronic hepatitis.



[Series 1: us abdomen complete w/ elastography · 0.17mm/px · 13 of 109 slices shown]
[im 1/109]
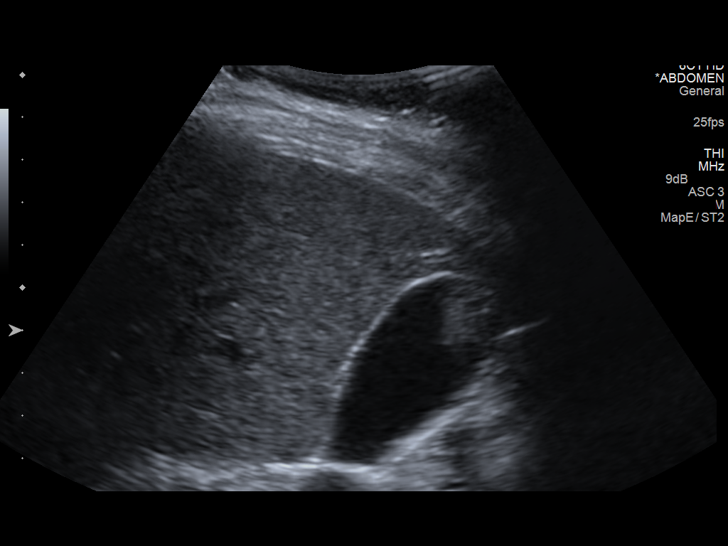
[im 10/109]
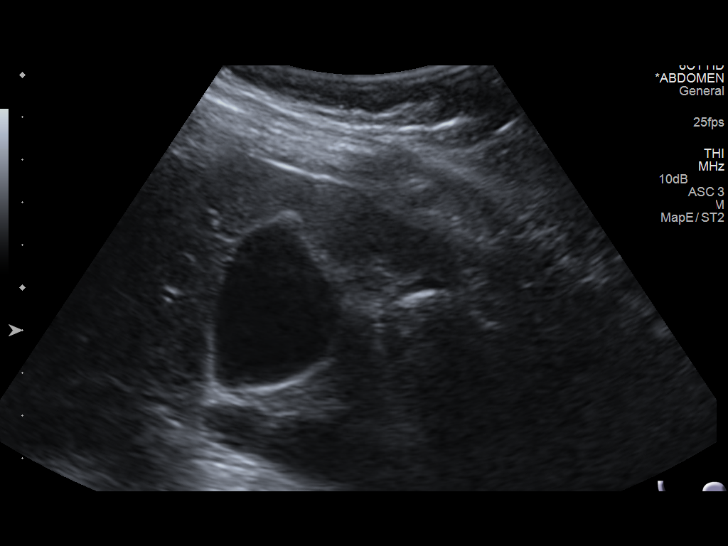
[im 19/109]
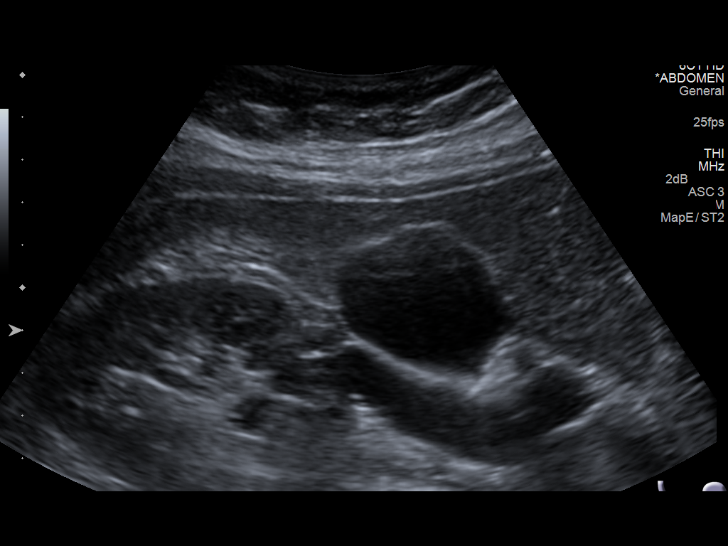
[im 28/109]
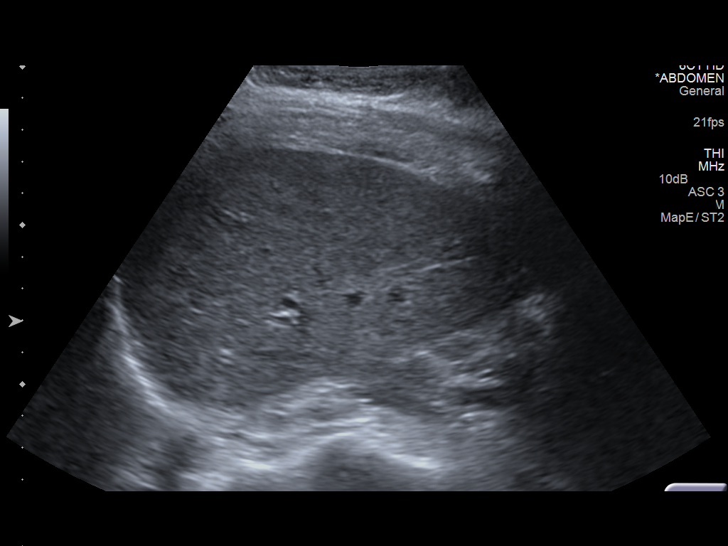
[im 37/109]
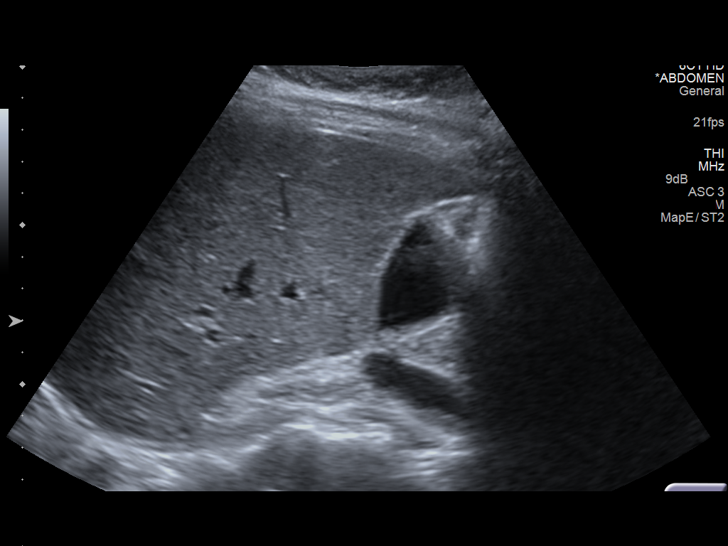
[im 46/109]
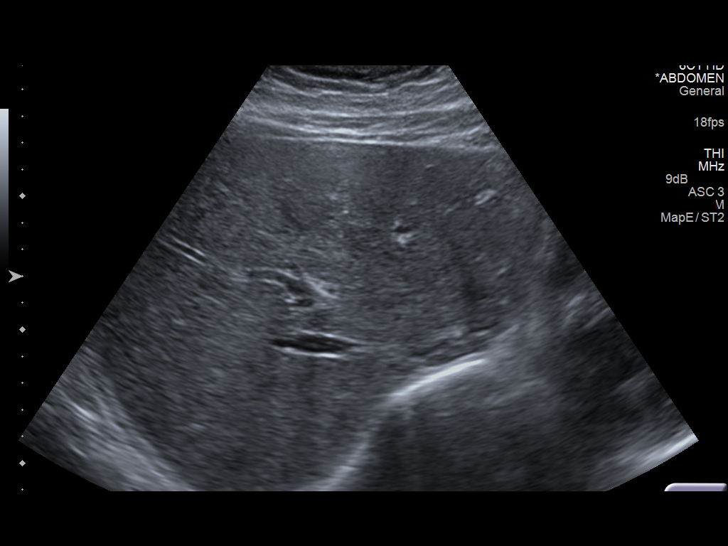
[im 55/109]
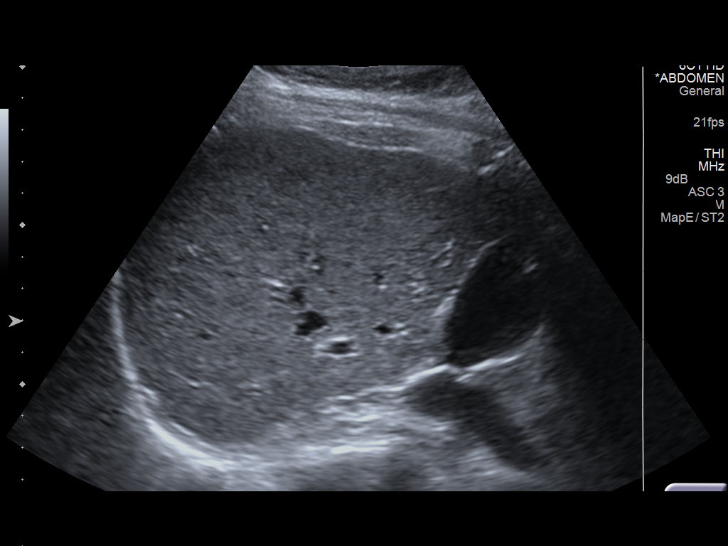
[im 64/109]
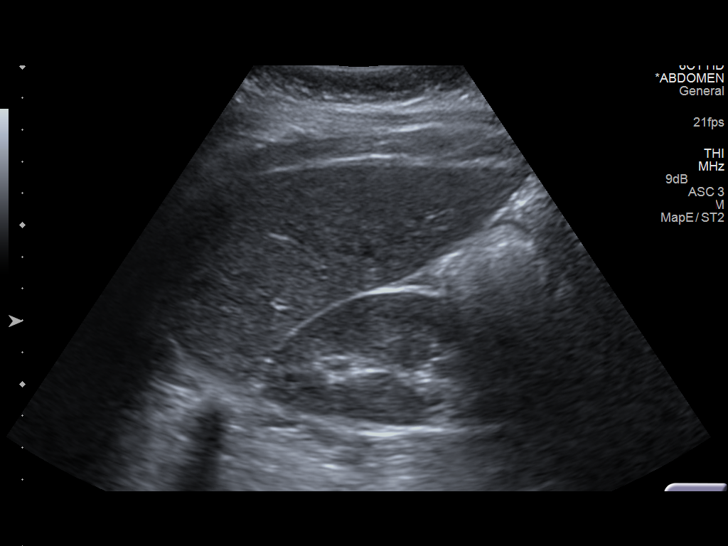
[im 73/109]
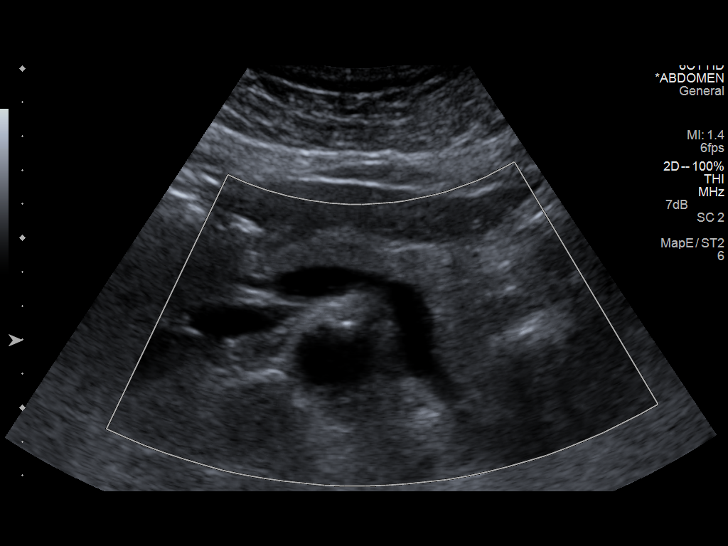
[im 82/109]
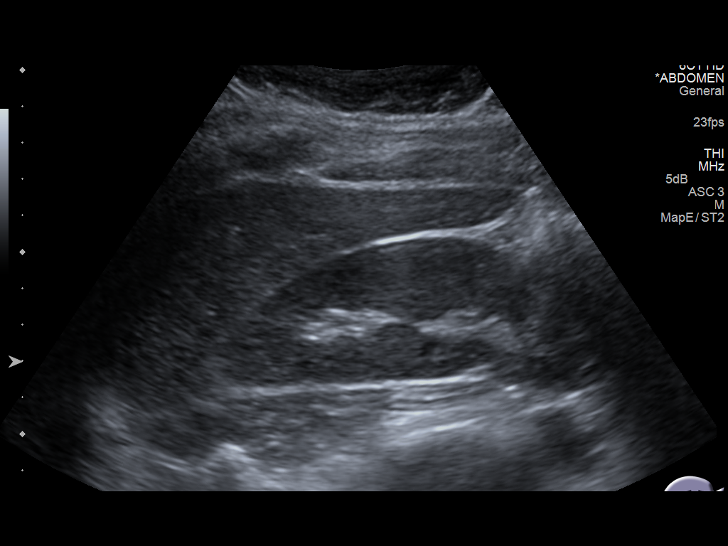
[im 91/109]
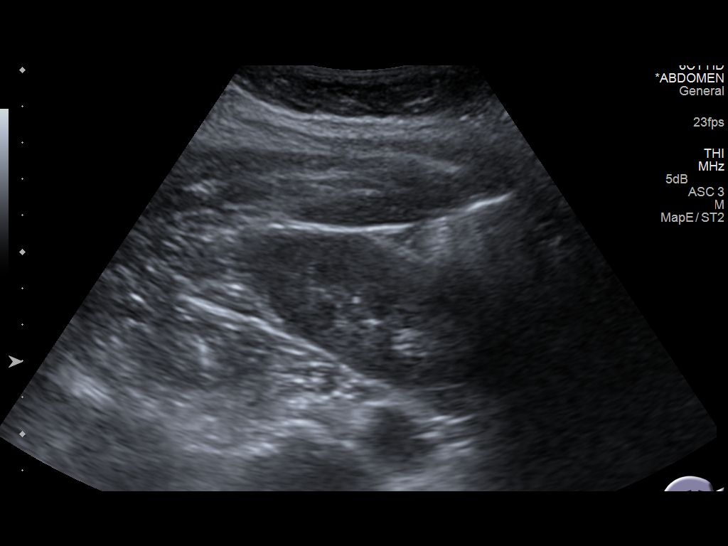
[im 100/109]
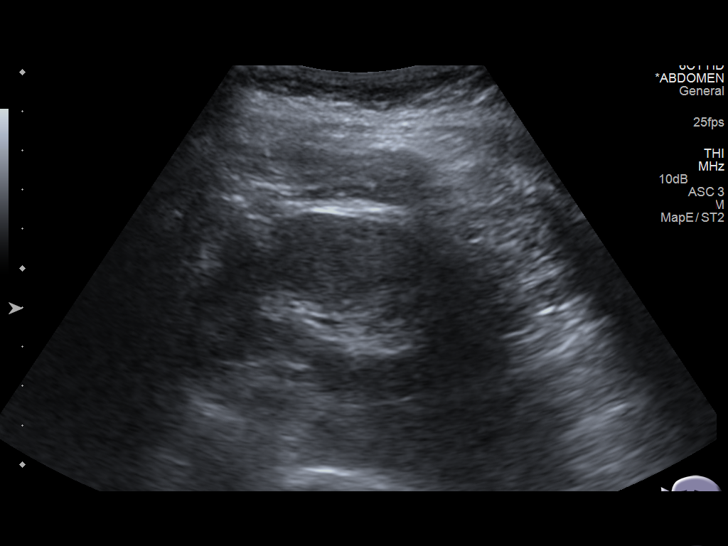
[im 109/109]
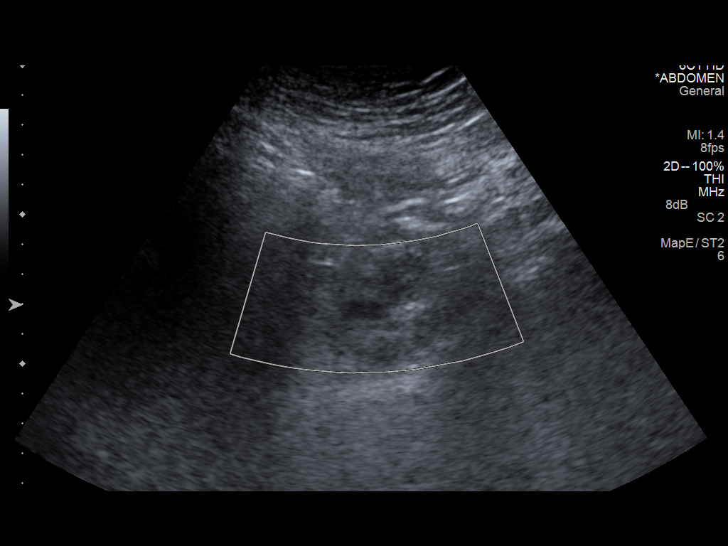

[13 of 25 positions shown; findings below may reference images not displayed]

FINDINGS: ULTRASOUND ABDOMEN

Gallbladder: No gallstones or wall thickening visualized. No
sonographic Murphy sign noted.

Common bile duct: Diameter: 2.7 mm

Liver: Normal echogenicity without focal lesion or biliary
dilatation.

IVC: Normal caliber

Pancreas: Sonographically unremarkable

Spleen: Normal size and echogenicity without focal lesions

Right Kidney: Length: 10.7 cm. Normal renal cortical thickness and
echogenicity without focal lesions or hydronephrosis.

Left Kidney: Length: 11.0 cm. Normal renal cortical thickness and
echogenicity without focal lesions or hydronephrosis.

Abdominal aorta: Normal caliber

Other findings: No ascites

ULTRASOUND HEPATIC ELASTOGRAPHY

Device: Siemens Helix VTQ

Transducer 6 HDC

Patient position: Left lateral decubitus

Number of measurements:  10

Hepatic Segment:  8

Median velocity:   1.25  m/sec

IQR:

IQR/Median velocity ratio

Corresponding Metavir fibrosis score:  F 2/F 3

Risk of fibrosis: Moderate

Limitations of exam: None

Pertinent findings noted on other imaging exams:  None

Please note that abnormal shear wave velocities may also be
identified in clinical settings other than with hepatic fibrosis,
such as: acute hepatitis, elevated right heart and central venous
pressures including use of beta blockers, Fizika disease
([place]), infiltrative processes such as
mastocytosis/amyloidosis/infiltrative tumor, extrahepatic
cholestasis, in the post-prandial state, and liver transplantation.
Correlation with patient history, laboratory data, and clinical
condition recommended.
IMPRESSION: 1. Unremarkable abdominal ultrasound examination. No focal hepatic
lesions or intrahepatic biliary dilatation.

Median hepatic shear wave velocity is calculated at 1.25 m/sec.

Corresponding Metavir fibrosis score is F 2/F 3.

Risk of fibrosis is moderate.

Follow-up:  Additional testing is appropriate.

## 2016-12-22 ENCOUNTER — Other Ambulatory Visit: Payer: Self-pay | Admitting: Nurse Practitioner

## 2016-12-22 DIAGNOSIS — B181 Chronic viral hepatitis B without delta-agent: Secondary | ICD-10-CM

## 2017-01-05 ENCOUNTER — Other Ambulatory Visit: Payer: BLUE CROSS/BLUE SHIELD

## 2017-01-06 ENCOUNTER — Other Ambulatory Visit: Payer: BLUE CROSS/BLUE SHIELD

## 2017-01-16 ENCOUNTER — Other Ambulatory Visit: Payer: BLUE CROSS/BLUE SHIELD

## 2017-01-24 ENCOUNTER — Ambulatory Visit
Admission: RE | Admit: 2017-01-24 | Discharge: 2017-01-24 | Disposition: A | Discharge status: Home / Self Care | Payer: BLUE CROSS/BLUE SHIELD | Source: Ambulatory Visit | Attending: Nurse Practitioner | Admitting: Nurse Practitioner

## 2017-01-24 DIAGNOSIS — B181 Chronic viral hepatitis B without delta-agent: Secondary | ICD-10-CM

## 2017-03-24 ENCOUNTER — Encounter (HOSPITAL_COMMUNITY): Payer: Self-pay | Admitting: Emergency Medicine

## 2017-03-24 ENCOUNTER — Emergency Department (HOSPITAL_COMMUNITY): Payer: BLUE CROSS/BLUE SHIELD

## 2017-03-24 ENCOUNTER — Emergency Department (HOSPITAL_COMMUNITY)
Admission: EM | Admit: 2017-03-24 | Discharge: 2017-03-24 | Disposition: A | Discharge status: Home / Self Care | Payer: BLUE CROSS/BLUE SHIELD | Attending: Emergency Medicine | Admitting: Emergency Medicine

## 2017-03-24 DIAGNOSIS — Z7984 Long term (current) use of oral hypoglycemic drugs: Secondary | ICD-10-CM | POA: Diagnosis not present

## 2017-03-24 DIAGNOSIS — G44209 Tension-type headache, unspecified, not intractable: Secondary | ICD-10-CM

## 2017-03-24 DIAGNOSIS — I10 Essential (primary) hypertension: Secondary | ICD-10-CM | POA: Diagnosis not present

## 2017-03-24 DIAGNOSIS — E119 Type 2 diabetes mellitus without complications: Secondary | ICD-10-CM | POA: Insufficient documentation

## 2017-03-24 DIAGNOSIS — R51 Headache: Secondary | ICD-10-CM | POA: Insufficient documentation

## 2017-03-24 MED ORDER — ORPHENADRINE CITRATE ER 100 MG PO TB12: 100.0000 mg | ORAL_TABLET | Freq: Two times a day (BID) | ORAL | 0 refills | Status: AC | PRN

## 2017-03-24 NOTE — ED Provider Notes (Signed)
[facility] DEPT Provider Note   CSN: [id number] Arrival date & time: 03/24/17  0211     History   Chief Complaint Chief Complaint  Patient presents with  . Headache  . Hypertension    HPI Evelyn Wolf is a 56 y.o. female.  The history is provided by the patient.  She has been having intermittent headaches for about the last 2 weeks. This is unusual for her. Headache is bifrontal. She has difficulty characterizing it. There is no visual change, nausea, vomiting, weakness, dizziness. She takes Aleve for the headache which gives her relief, but the headache will come back. It has not woken her from sleep. She went to her doctor yesterday and found that her blood pressure was elevated, so they are adjusting her blood pressure medicines. She thinks her blood pressure was in proximally 165/100. Tonight, she checked her blood pressure at home and it was going higher, so she came to the ED.  Past Medical History:  Diagnosis Date  . Asthma in adult   . Diabetes mellitus without complication ([place])    type 2  . Hepatitis    Hep B   . Hyperlipidemia   . Hypertension     Patient Active Problem List   Diagnosis Date Noted  . Postmenopausal bleeding 02/12/2016  . Chronic hepatitis B virus infection ([place]) 02/12/2016  . Diabetes mellitus ([place]) 02/12/2016  . BP (high blood pressure) 02/12/2016    Past Surgical History:  Procedure Laterality Date  . CESAREAN SECTION  1994  . DILATATION & CURRETTAGE/HYSTEROSCOPY WITH RESECTOCOPE N/A 02/12/2016   Procedure: DILATATION & CURETTAGE/HYSTEROSCOPY, POLYPECTOMY;  Surgeon: Eldred Manges, MD;  Location: [facility];  Service: Gynecology;  Laterality: N/A;  . DILATION AND CURETTAGE OF UTERUS      OB History    No data available       Home Medications    Prior to Admission medications   Medication Sig Start Date End Date Taking? Authorizing Provider  atorvastatin (LIPITOR) 40 MG tablet Take 40 mg by mouth daily. 12/22/15   [provider]  BARACLUDE 0.5 MG tablet Take 0.5 mg by mouth daily.  12/08/15   [provider]  ibuprofen (ADVIL,MOTRIN) 600 MG tablet Ibuprofen 600 mg by mouth every 6 hours for 3 days and then every 6 hours as needed for pain 02/12/16   Haygood, Seymour Bars, MD  losartan (COZAAR) 100 MG tablet Take 100 mg by mouth daily. 12/22/15   [provider]  metFORMIN (GLUCOPHAGE-XR) 500 MG 24 hr tablet Take 500 mg by mouth daily. 01/06/16   [provider]  triamterene-hydrochlorothiazide (MAXZIDE-25) 37.5-25 MG tablet Take 1 tablet by mouth daily. 12/22/15   [provider]    Family History Family History  Problem Relation Age of Onset  . Hypertension Mother     Social History Social History  Substance Use Topics  . Smoking status: Never Smoker  . Smokeless tobacco: Never Used  . Alcohol use No     Allergies   Patient has no known allergies.   Review of Systems Review of Systems  All other systems reviewed and are negative.    Physical Exam Updated Vital Signs BP (!) 165/94 (BP Location: Left Arm)   Pulse 85   Temp 98.2 F (36.8 C) (Oral)   Resp 18   SpO2 96%   Physical Exam  Nursing note and vitals reviewed.  56 year old female, resting comfortably and in no acute distress. Vital signs are significant for  hypertension. Oxygen saturation is 96%, which is normal. Head is normocephalic and atraumatic. PERRLA, EOMI. Oropharynx is clear. Neck is nontender and supple without adenopathy or JVD. Back is nontender and there is no CVA tenderness. Lungs are clear without rales, wheezes, or rhonchi. Chest is nontender. Heart has regular rate and rhythm without murmur. Abdomen is soft, flat, nontender without masses or hepatosplenomegaly and peristalsis is normoactive. Extremities have no cyanosis or edema, full range of motion is present. Skin is warm and dry without rash. Neurologic: Mental status is normal, cranial nerves are intact, there are no  motor or sensory deficits.  ED Treatments / Results   Radiology Ct Head Wo Contrast  Result Date: 03/24/2017 CLINICAL DATA:  Headaches for 1 week, history of hypertension, initial encounter EXAM: CT HEAD WITHOUT CONTRAST TECHNIQUE: Contiguous axial images were obtained from the base of the skull through the vertex without intravenous contrast. COMPARISON:  None. FINDINGS: Brain: No evidence of acute infarction, hemorrhage, hydrocephalus, extra-axial collection or mass lesion/mass effect. Vascular: No hyperdense vessel or unexpected calcification. Skull: Normal. Negative for fracture or focal lesion. Sinuses/Orbits: No acute finding. Other: None. IMPRESSION: No acute intracranial abnormality noted. Electronically Signed   By: Inez Catalina M.D.   On: 03/24/2017 07:13    Procedures Procedures (including critical care time)  Medications Ordered in ED Medications - No data to display   Initial Impression / Assessment and Plan / ED Course  I have reviewed the triage vital signs and the nursing notes.  Pertinent imaging results that were available during my care of the patient were reviewed by me and considered in my medical decision making (see chart for details).  Headache which is most likely muscle contraction headache. Headache is not present currently, so no specific headache treatment is given in the ED. Because this is a change in pattern of headaches, she is sent for CT of head. Old records are reviewed, and I see no relevant past visits.  CT is unremarkable. Patient informs me that her daughter recently got married, and her husband had a stroke 5 days before the wedding. This coincided with when she started having the headaches. This is certainly consistent with muscle contraction headache. She is advised to continue using over-the-counter NSAIDs as needed for her headache, which is also given a prescription for orphenadrine to see if it gives some relief. Also advised on topical methods  that can be helpful, such as ice. Blood pressure has been mildly elevated in the ED. She is advised to monitor her blood pressure at home and keep a record of evident take that with her when she sees her PCP.  Final Clinical Impressions(s) / ED Diagnoses   Final diagnoses:  Muscle contraction headache  Essential hypertension    New Prescriptions New Prescriptions   ORPHENADRINE (NORFLEX) 100 MG TABLET    Take 1 tablet (100 mg total) by mouth 2 (two) times daily as needed for muscle spasms.     Delora Fuel, MD 72/62/03 [phone]

## 2017-03-24 NOTE — Discharge Instructions (Signed)
Monitor your blood pressure at home. Keep a record of it, and take it with you when you see your doctor.  Continue taking ibuprofen or naproxen as needed for your headache. You may also use an ice pack to help with the headache.

## 2017-03-24 NOTE — ED Notes (Signed)
Patient transported to CT 

## 2017-03-24 NOTE — ED Triage Notes (Signed)
Pt presents to ED for assessment of frontal headache today and reoccurring x 1 month.  Pt states she has been dealing with the stressors of her daughter's wedding, and thought the headaches were being caused by such.  Pt sts she went to her PCP this morning and had hypertension (currently on losartan and another med) and states she then had increase in headache through out the day, same as previous weeks, but worse.  Pt denies any neuro symptoms.

## 2017-06-30 ENCOUNTER — Other Ambulatory Visit: Payer: Self-pay | Admitting: Nurse Practitioner

## 2017-06-30 DIAGNOSIS — B181 Chronic viral hepatitis B without delta-agent: Secondary | ICD-10-CM

## 2017-09-28 ENCOUNTER — Ambulatory Visit
Admission: RE | Admit: 2017-09-28 | Discharge: 2017-09-28 | Disposition: A | Discharge status: Home / Self Care | Payer: BLUE CROSS/BLUE SHIELD | Source: Ambulatory Visit | Attending: Nurse Practitioner | Admitting: Nurse Practitioner

## 2017-09-28 DIAGNOSIS — B181 Chronic viral hepatitis B without delta-agent: Secondary | ICD-10-CM

## 2018-08-22 ENCOUNTER — Other Ambulatory Visit: Payer: Self-pay | Admitting: Nurse Practitioner

## 2018-08-22 DIAGNOSIS — B181 Chronic viral hepatitis B without delta-agent: Secondary | ICD-10-CM

## 2018-09-03 ENCOUNTER — Other Ambulatory Visit: Payer: BLUE CROSS/BLUE SHIELD

## 2018-09-03 ENCOUNTER — Ambulatory Visit
Admission: RE | Admit: 2018-09-03 | Discharge: 2018-09-03 | Disposition: A | Discharge status: Home / Self Care | Payer: BLUE CROSS/BLUE SHIELD | Source: Ambulatory Visit | Attending: Nurse Practitioner | Admitting: Nurse Practitioner

## 2018-09-03 DIAGNOSIS — B181 Chronic viral hepatitis B without delta-agent: Secondary | ICD-10-CM

## 2019-03-26 ENCOUNTER — Other Ambulatory Visit: Payer: Self-pay | Admitting: Nurse Practitioner

## 2019-03-26 DIAGNOSIS — B181 Chronic viral hepatitis B without delta-agent: Secondary | ICD-10-CM

## 2019-04-10 ENCOUNTER — Ambulatory Visit
Admission: RE | Admit: 2019-04-10 | Discharge: 2019-04-10 | Disposition: A | Discharge status: Home / Self Care | Payer: BLUE CROSS/BLUE SHIELD | Source: Ambulatory Visit | Attending: Nurse Practitioner | Admitting: Nurse Practitioner

## 2019-04-10 DIAGNOSIS — B181 Chronic viral hepatitis B without delta-agent: Secondary | ICD-10-CM

## 2019-08-06 ENCOUNTER — Other Ambulatory Visit: Payer: Self-pay | Admitting: Obstetrics and Gynecology

## 2019-08-06 DIAGNOSIS — N6320 Unspecified lump in the left breast, unspecified quadrant: Secondary | ICD-10-CM

## 2019-08-06 DIAGNOSIS — N644 Mastodynia: Secondary | ICD-10-CM

## 2019-08-07 ENCOUNTER — Ambulatory Visit
Admission: RE | Admit: 2019-08-07 | Discharge: 2019-08-07 | Disposition: A | Discharge status: Home / Self Care | Payer: BC Managed Care – PPO | Source: Ambulatory Visit | Attending: Obstetrics and Gynecology | Admitting: Obstetrics and Gynecology

## 2019-08-07 ENCOUNTER — Other Ambulatory Visit: Payer: Self-pay | Admitting: Obstetrics and Gynecology

## 2019-08-07 ENCOUNTER — Other Ambulatory Visit: Payer: Self-pay

## 2019-08-07 DIAGNOSIS — N6320 Unspecified lump in the left breast, unspecified quadrant: Secondary | ICD-10-CM

## 2019-08-07 DIAGNOSIS — N644 Mastodynia: Secondary | ICD-10-CM

## 2019-09-03 ENCOUNTER — Other Ambulatory Visit: Payer: Self-pay | Admitting: Nurse Practitioner

## 2019-09-03 DIAGNOSIS — B182 Chronic viral hepatitis C: Secondary | ICD-10-CM

## 2019-10-07 ENCOUNTER — Other Ambulatory Visit: Payer: BC Managed Care – PPO

## 2019-11-06 ENCOUNTER — Ambulatory Visit
Admission: RE | Admit: 2019-11-06 | Discharge: 2019-11-06 | Disposition: A | Discharge status: Home / Self Care | Payer: BC Managed Care – PPO | Source: Ambulatory Visit | Attending: Nurse Practitioner | Admitting: Nurse Practitioner

## 2019-11-06 DIAGNOSIS — B182 Chronic viral hepatitis C: Secondary | ICD-10-CM

## 2019-11-08 ENCOUNTER — Other Ambulatory Visit: Payer: BC Managed Care – PPO

## 2020-03-18 IMAGING — US US BREAST*L* LIMITED INC AXILLA
1 series · 6 of 6 positions shown · non-contrast
Comparison: Previous exam(s).

CLINICAL DATA: 57-year-old woman with superficial palpable lump 12
o'clock position left breast. She does not know of any definite
trauma to this region recently, but says her toddler grandson could
have potentially cause some bruising in this area.

EXAM:
DIGITAL DIAGNOSTIC BILATERAL MAMMOGRAM WITH CAD AND TOMO
ULTRASOUND LEFT BREAST

[Series 1: us breast*left* limited inc axilla · 0.06mm/px · 6 of 6 slices shown]
[im 1/6]
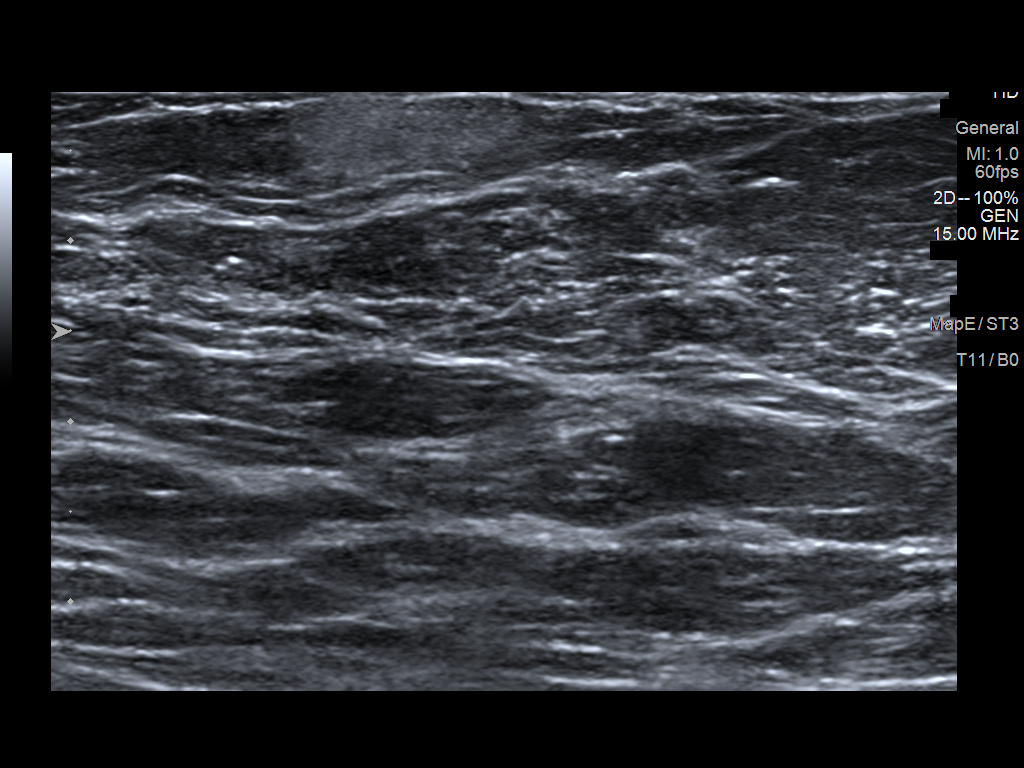
[im 2/6]
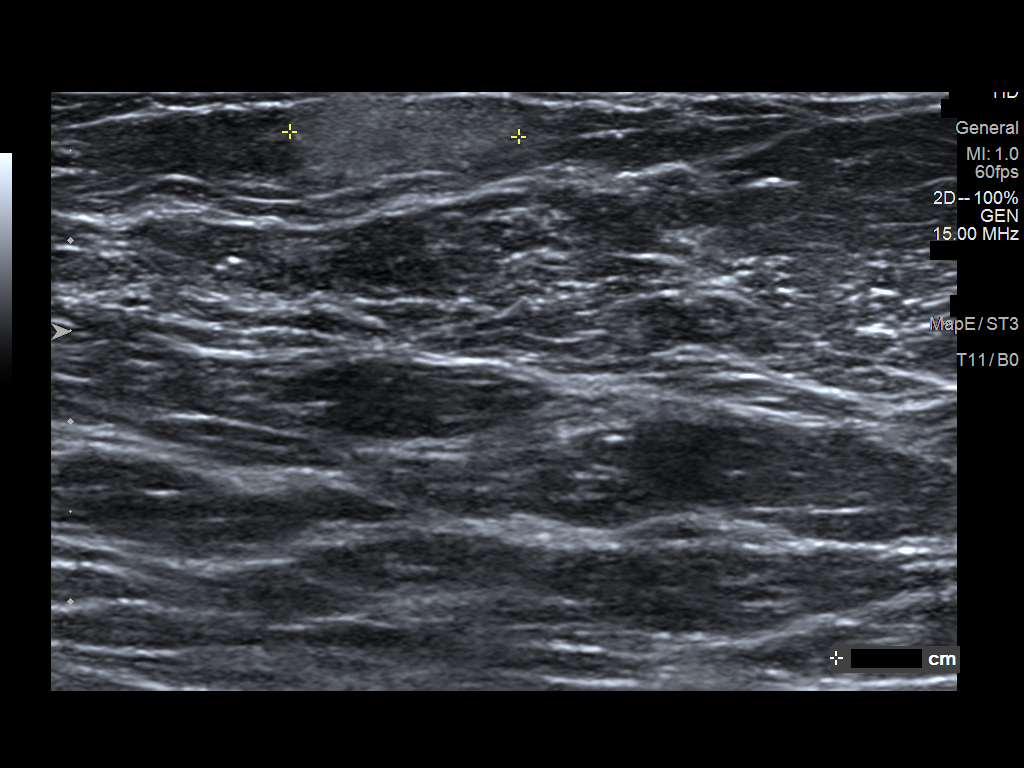
[im 3/6]
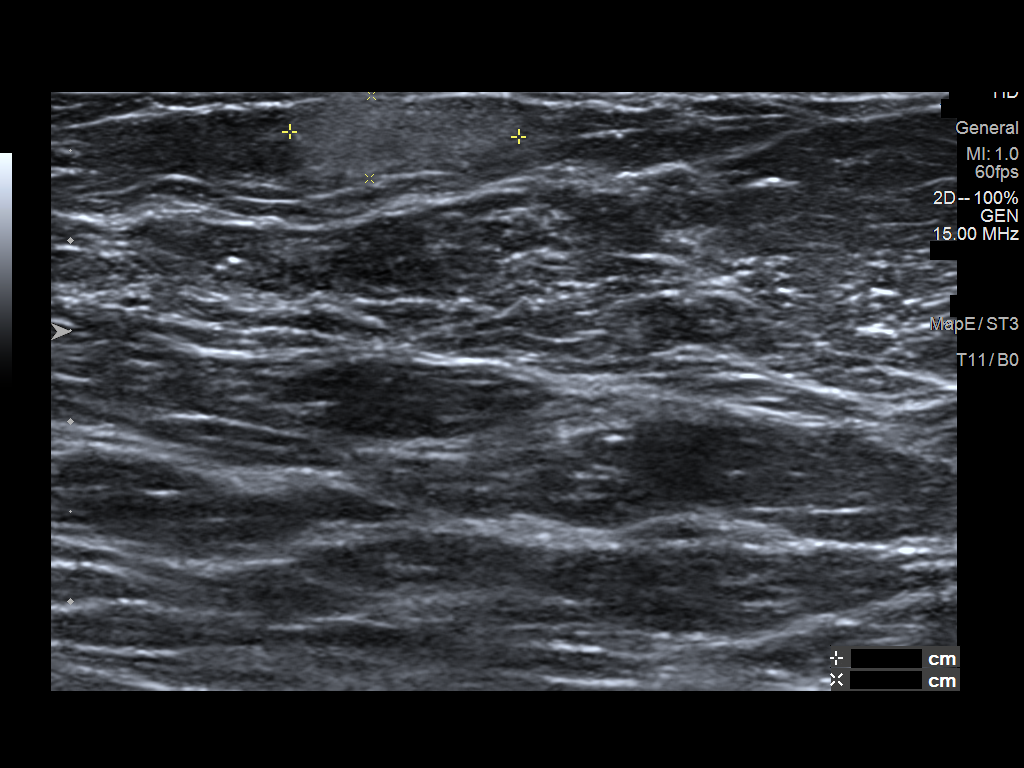
[im 4/6]
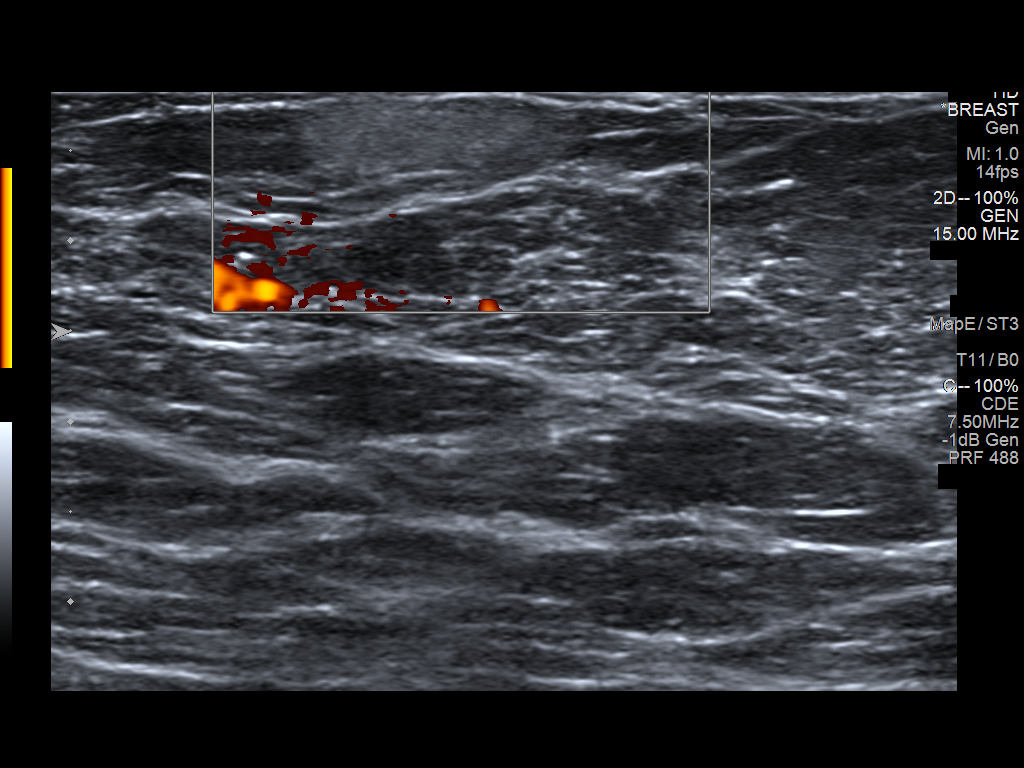
[im 5/6]
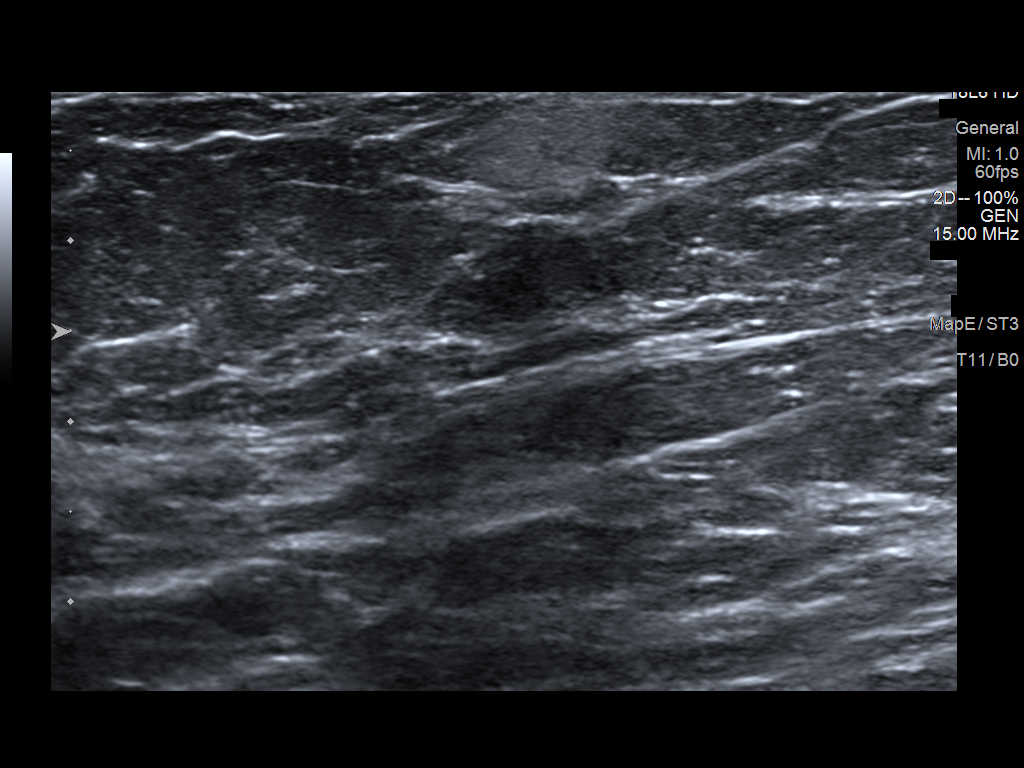
[im 6/6]
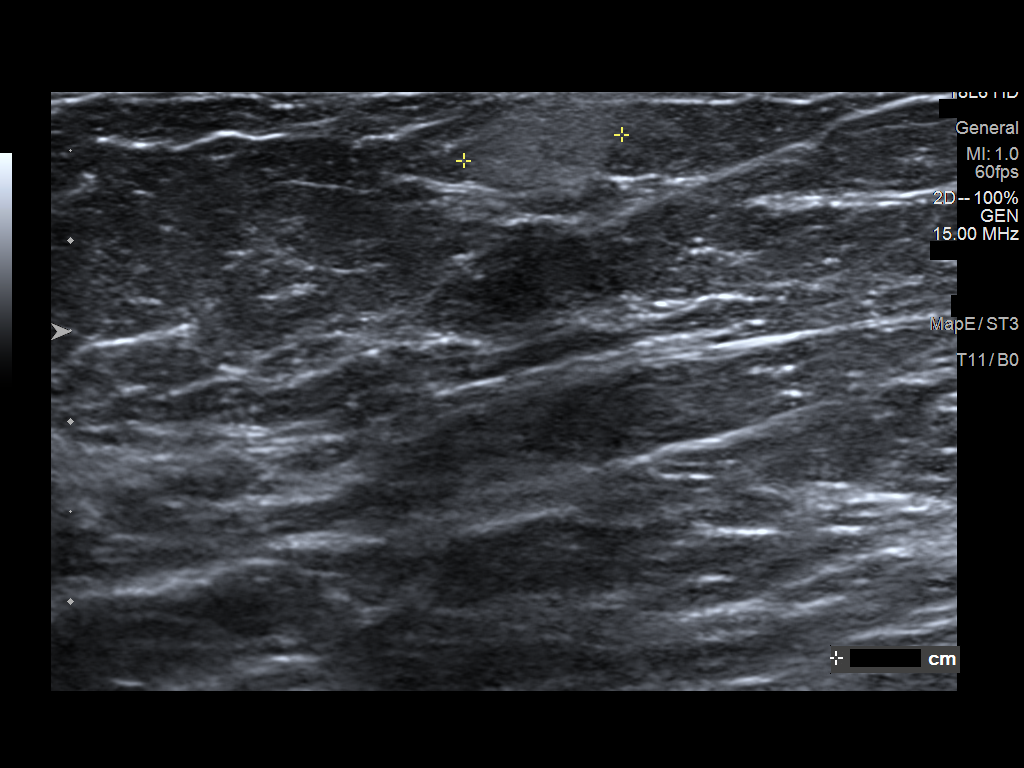

[6 of 6 positions shown; findings below may reference images not displayed]

ACR Breast Density Category b: There are scattered areas of
fibroglandular density.
FINDINGS: No mass, architectural distortion, or suspicious microcalcification
is identified to suggest malignancy in either breast. Spot
tangential view of the region of patient concern shows is a
superficial predominately fatty mass.

Mammographic images were processed with CAD.

On physical exam, I do palpate a discrete 1.5 cm lump in the 12
o'clock position of the left breast approximately 4 cm from the
nipple.

Targeted ultrasound is performed, showing a focal area of increased
echogenicity within a superficial fat lobule at 12 o'clock position
4 cm from the nipple in the region of palpable concern. The
increased echogenicity spans approximately 1.3 x 0.5 x 0.9 cm. No
internal vascular flow is identified. Overlying skin is normal in
thickness in appearance.
IMPRESSION: Probable benign fat necrosis accounting for the palpable lump 12
o'clock position left breast.

No evidence of malignancy in the right breast.

RECOMMENDATION:
Left breast ultrasound is recommended in 3 months.

I have discussed the findings and recommendations with the patient.
If applicable, a reminder letter will be sent to the patient
regarding the next appointment.

BI-RADS CATEGORY  3: Probably benign.

## 2020-04-30 ENCOUNTER — Other Ambulatory Visit: Payer: Self-pay | Admitting: Nurse Practitioner

## 2020-04-30 DIAGNOSIS — B182 Chronic viral hepatitis C: Secondary | ICD-10-CM

## 2020-05-08 ENCOUNTER — Ambulatory Visit
Admission: RE | Admit: 2020-05-08 | Discharge: 2020-05-08 | Disposition: A | Discharge status: Home / Self Care | Payer: BC Managed Care – PPO | Source: Ambulatory Visit | Attending: Nurse Practitioner | Admitting: Nurse Practitioner

## 2020-05-08 DIAGNOSIS — B182 Chronic viral hepatitis C: Secondary | ICD-10-CM

## 2020-12-18 IMAGING — US US ABDOMEN LIMITED
1 series · 14 of 25 positions shown · non-contrast
Comparison: None.

CLINICAL DATA: History of hepatitis B for hepatocellular carcinoma
screening.

EXAM:
ULTRASOUND ABDOMEN LIMITED RIGHT UPPER QUADRANT

[Series 1: us abdomen limited · 0.23mm/px · 14 of 40 slices shown]
[im 1/40]
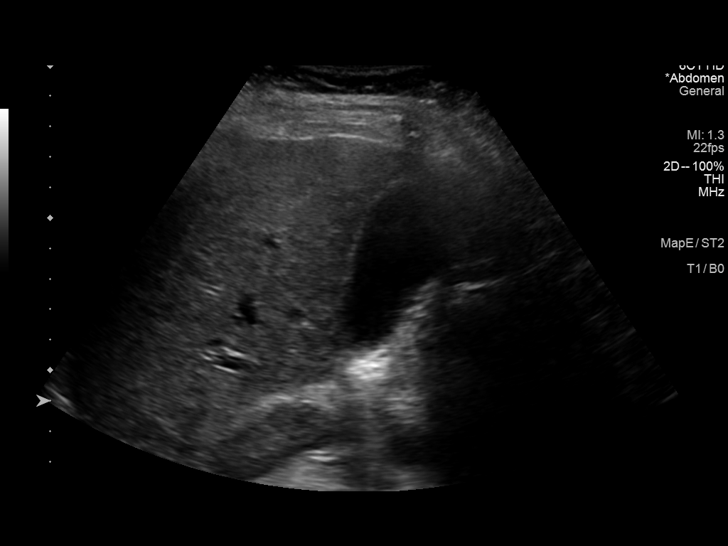
[im 4/40]
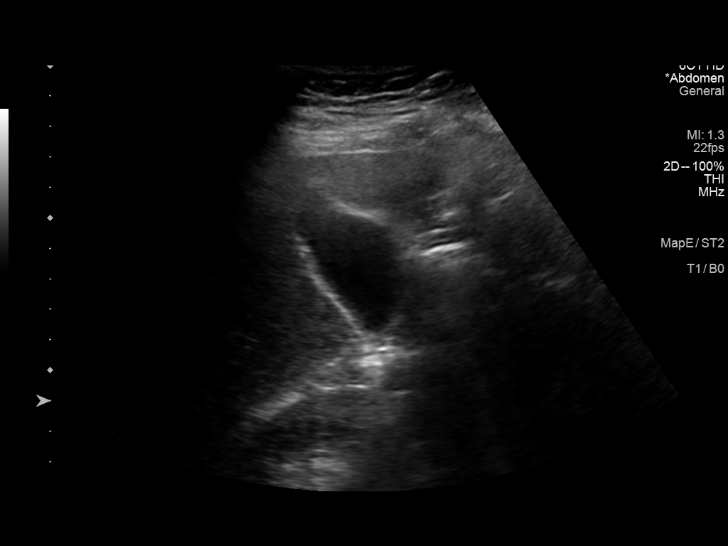
[im 7/40]
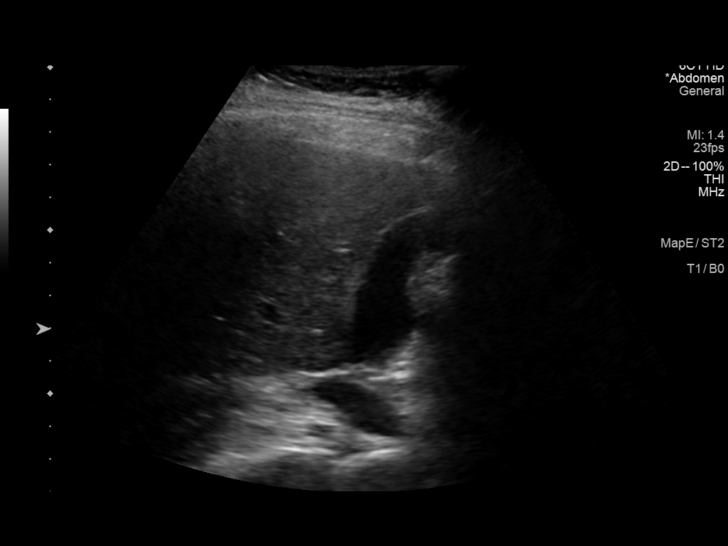
[im 10/40]
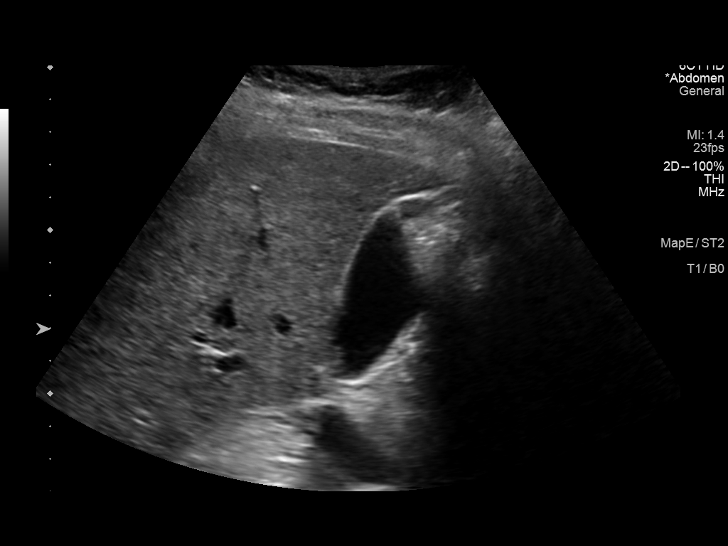
[im 14/40]
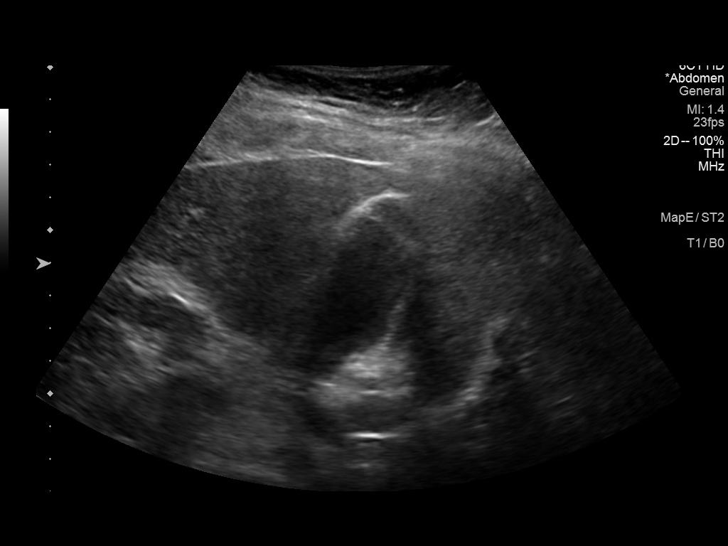
[im 15/40]
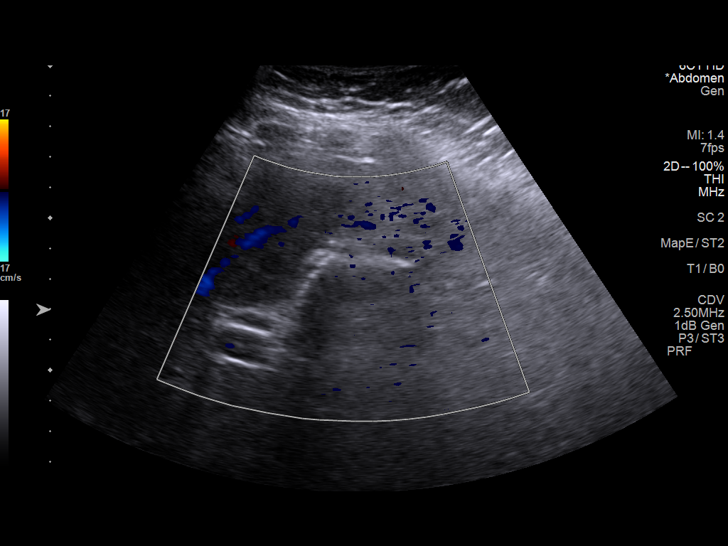
[im 18/40]
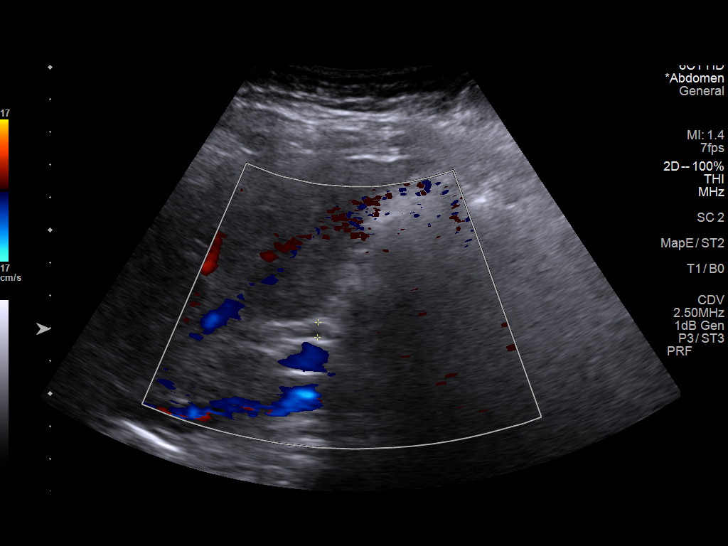
[im 22/40]
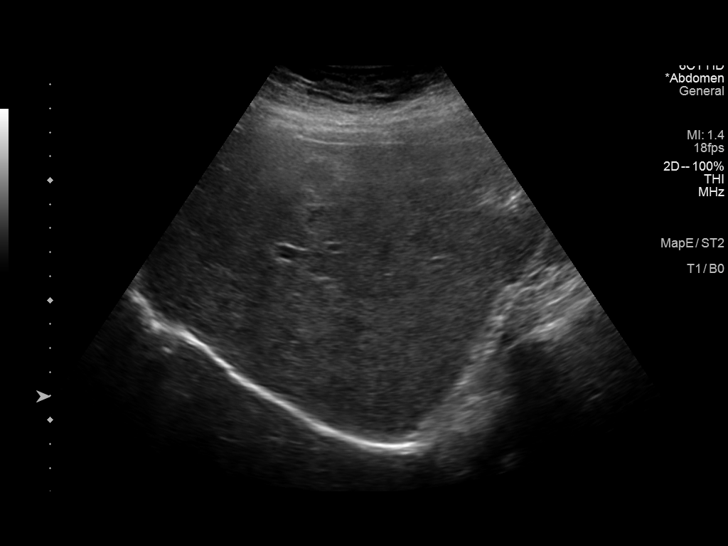
[im 25/40]
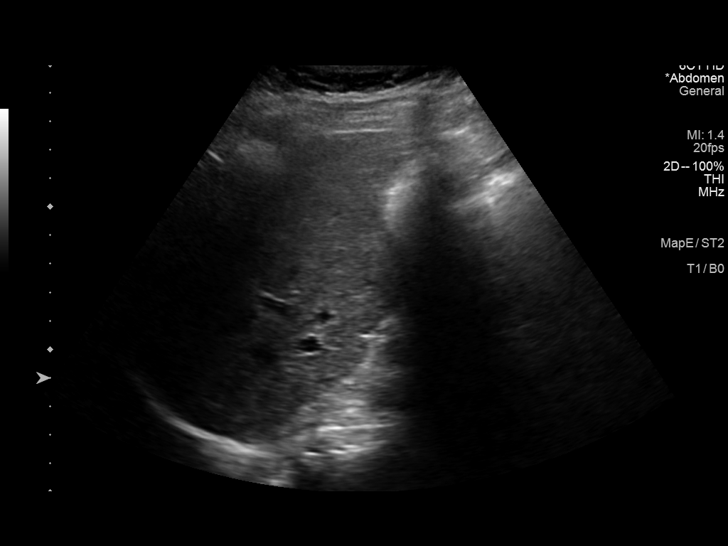
[im 27/40]
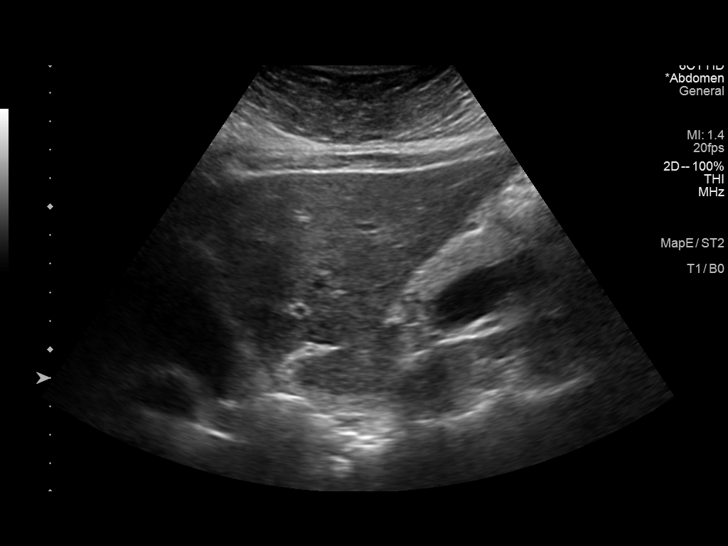
[im 30/40]
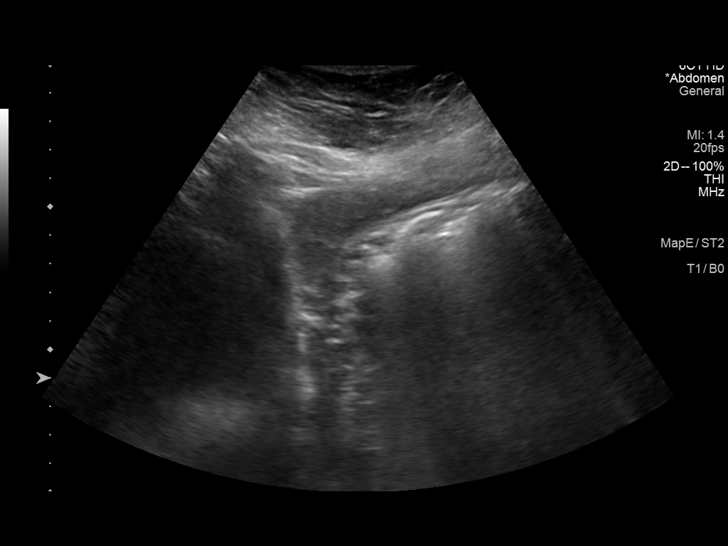
[im 33/40]
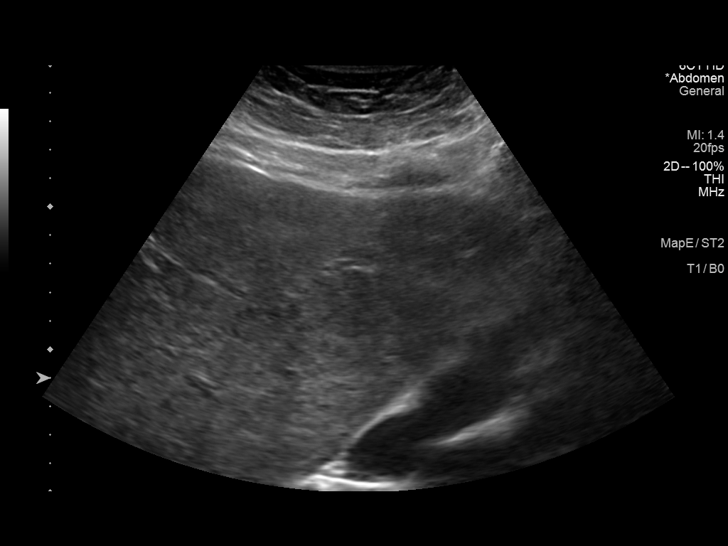
[im 36/40]
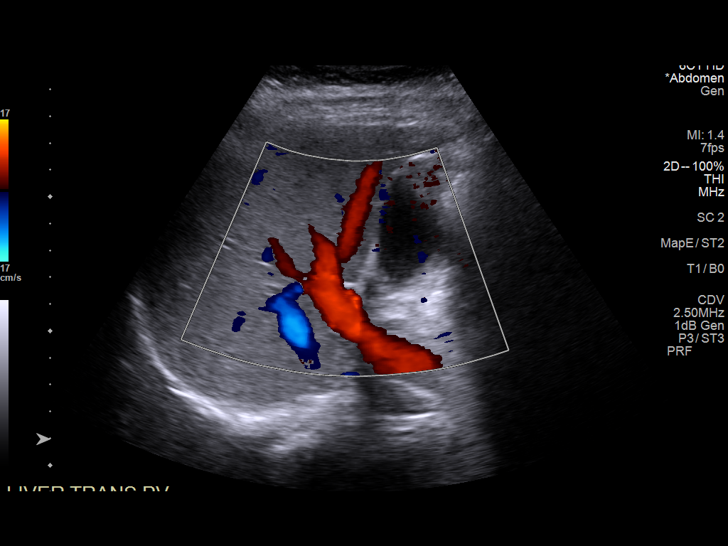
[im 40/40]
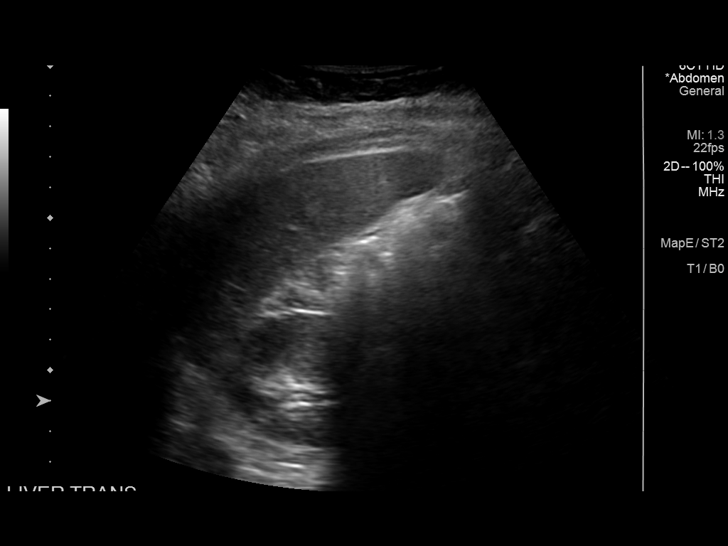

[14 of 25 positions shown; findings below may reference images not displayed]

FINDINGS: Gallbladder:

No gallstones or wall thickening visualized. No sonographic Murphy
sign noted by sonographer.

Common bile duct:

Diameter: 4.6 mm.

Liver:

No focal lesion identified. Within normal limits in parenchymal
echogenicity. Portal vein is patent on color Doppler imaging with
normal direction of blood flow towards the liver.

Other: No ascites.
IMPRESSION: Normal right upper quadrant ultrasound.

## 2021-01-27 ENCOUNTER — Other Ambulatory Visit: Payer: Self-pay | Admitting: Nurse Practitioner

## 2021-01-27 DIAGNOSIS — B181 Chronic viral hepatitis B without delta-agent: Secondary | ICD-10-CM

## 2021-01-27 DIAGNOSIS — K7402 Hepatic fibrosis, advanced fibrosis: Secondary | ICD-10-CM

## 2021-02-24 ENCOUNTER — Ambulatory Visit
Admission: RE | Admit: 2021-02-24 | Discharge: 2021-02-24 | Disposition: A | Discharge status: Home / Self Care | Payer: BC Managed Care – PPO | Source: Ambulatory Visit | Attending: Nurse Practitioner | Admitting: Nurse Practitioner

## 2021-02-24 DIAGNOSIS — K7402 Hepatic fibrosis, advanced fibrosis: Secondary | ICD-10-CM

## 2021-02-24 DIAGNOSIS — B181 Chronic viral hepatitis B without delta-agent: Secondary | ICD-10-CM

## 2021-10-06 IMAGING — US US ABDOMEN LIMITED
1 series · 14 of 25 positions shown · non-contrast
Comparison: 05/08/2020

CLINICAL DATA: Chronic hepatitis B

Advanced hepatic fibrosis
HCC screening
EXAM:
ULTRASOUND ABDOMEN LIMITED RIGHT UPPER QUADRANT

[Series 1: us abdomen limited · 0.23mm/px · 14 of 34 slices shown]
[im 1/34]
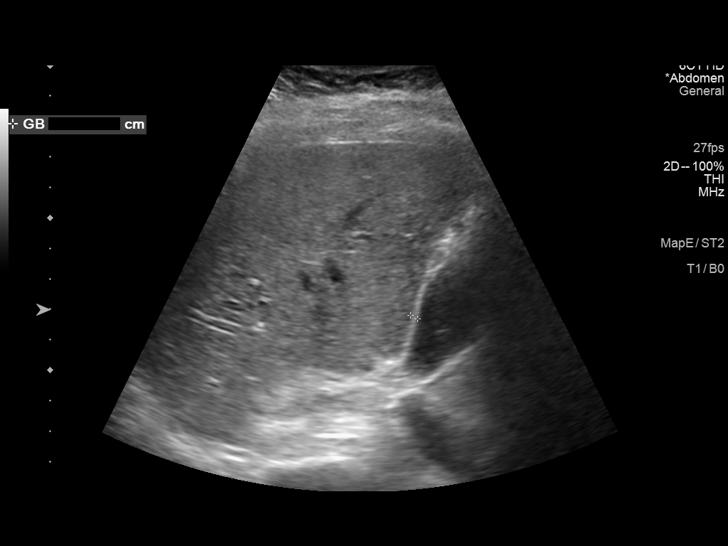
[im 3/34]
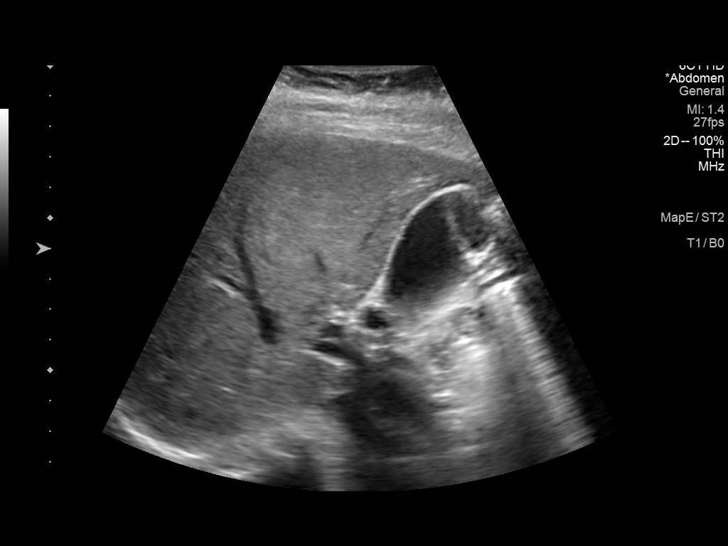
[im 6/34]
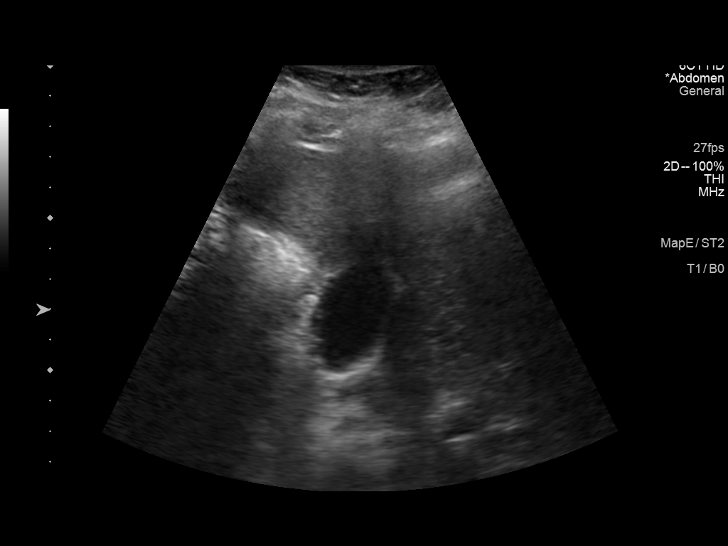
[im 9/34]
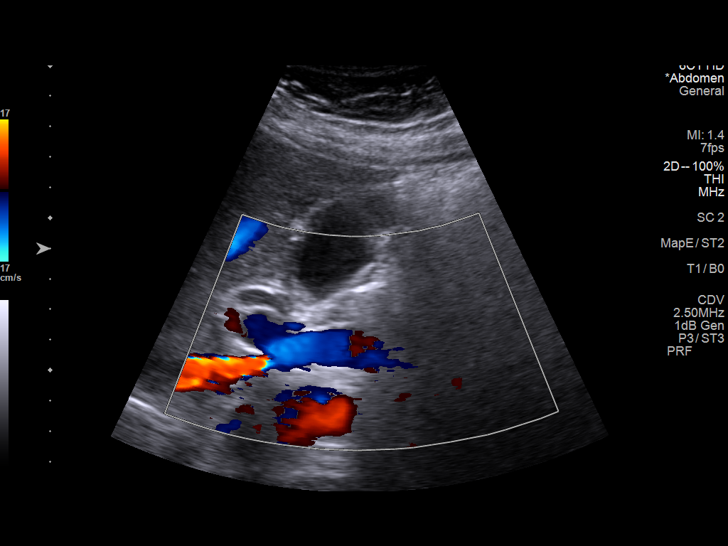
[im 12/34]
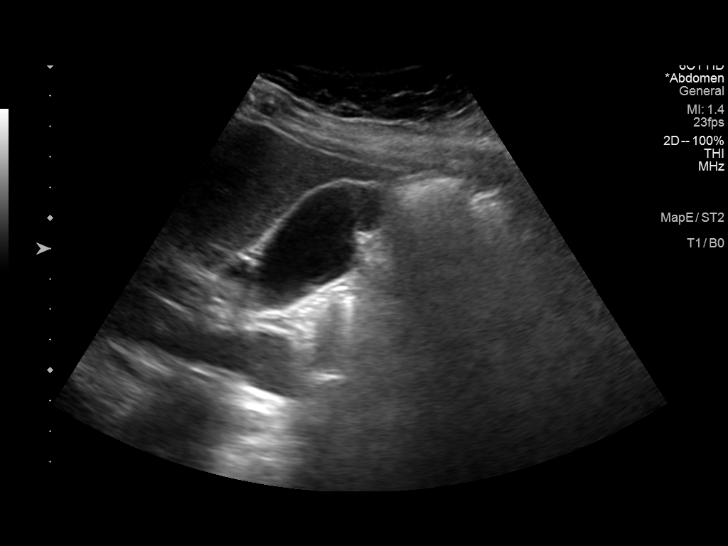
[im 13/34]
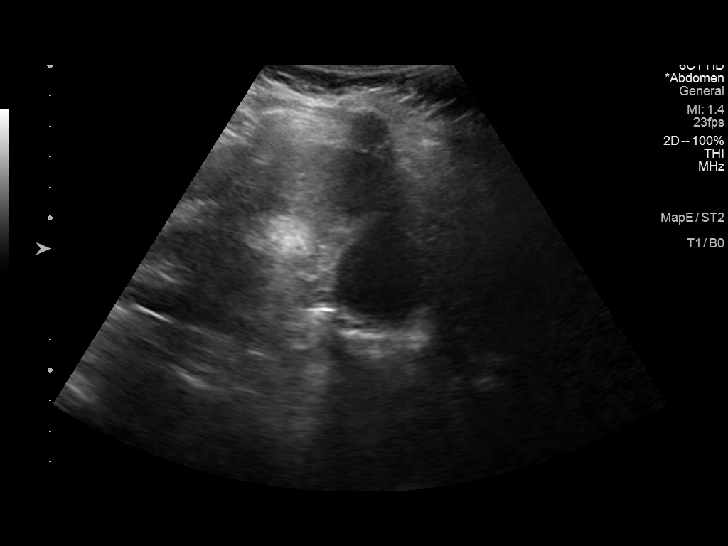
[im 16/34]
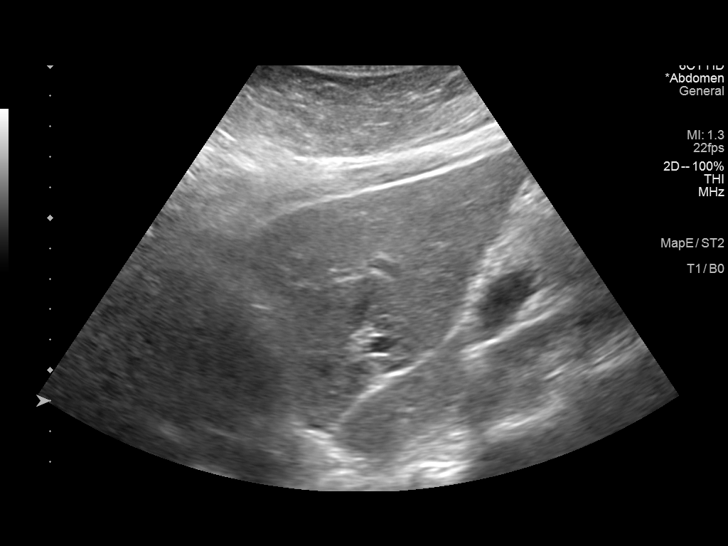
[im 18/34]
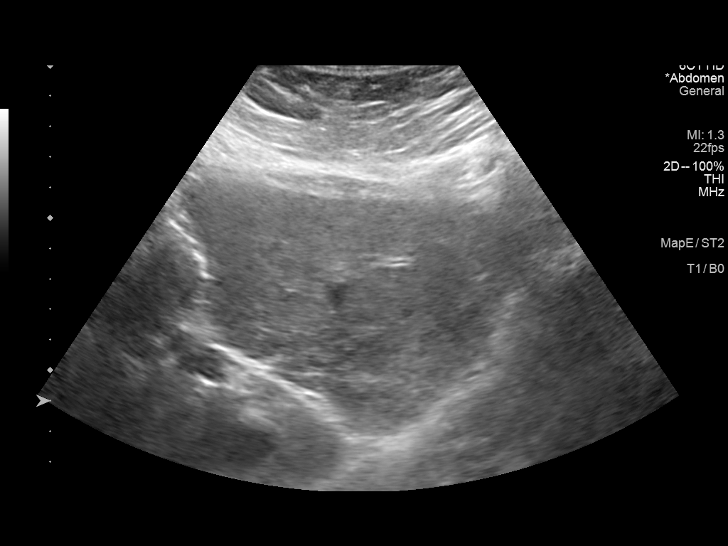
[im 21/34]
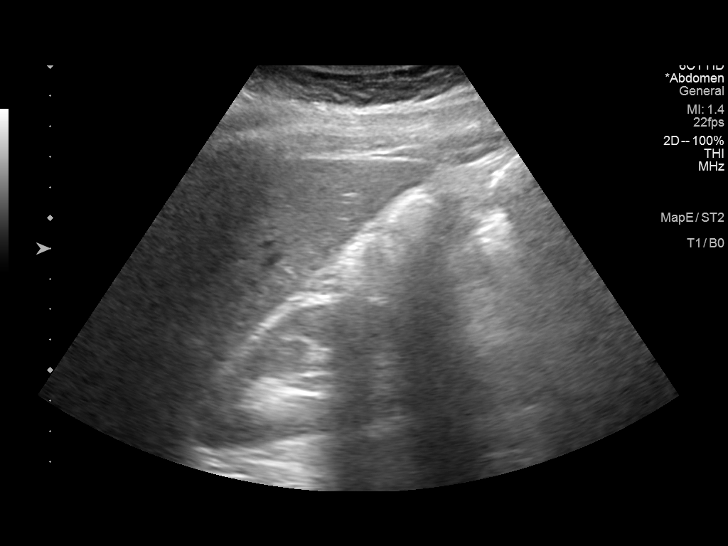
[im 23/34]
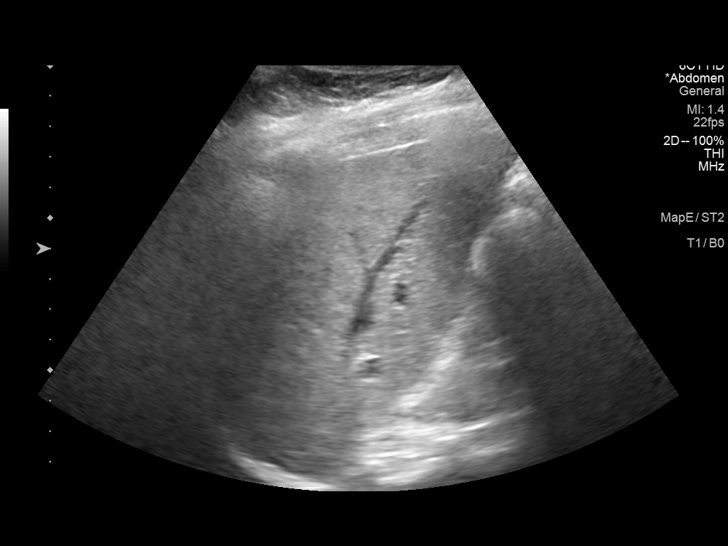
[im 25/34]
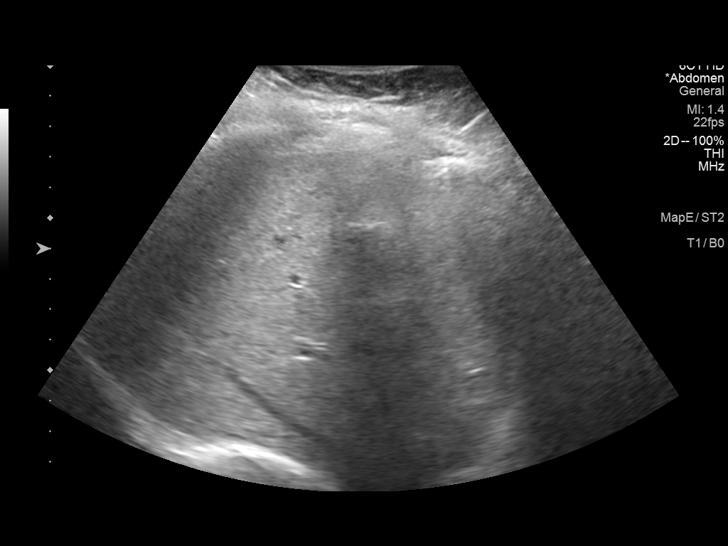
[im 28/34]
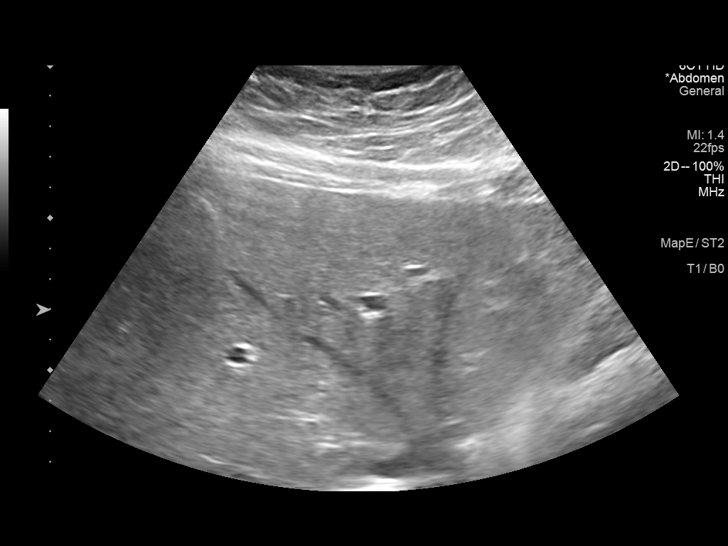
[im 31/34]
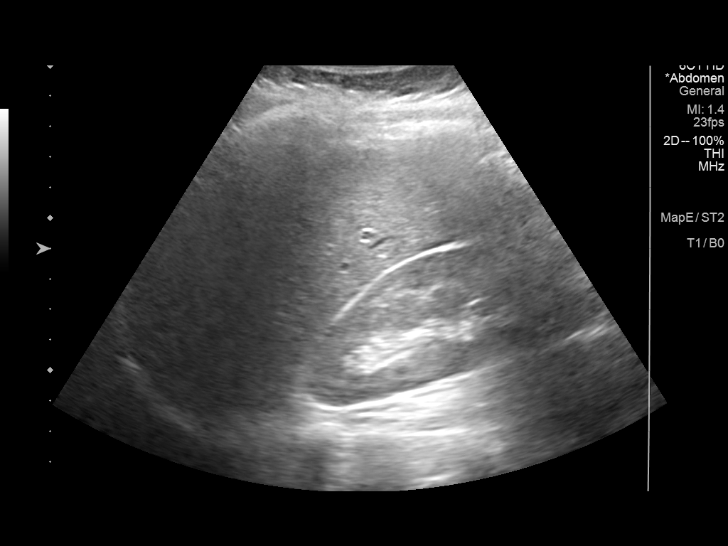
[im 34/34]
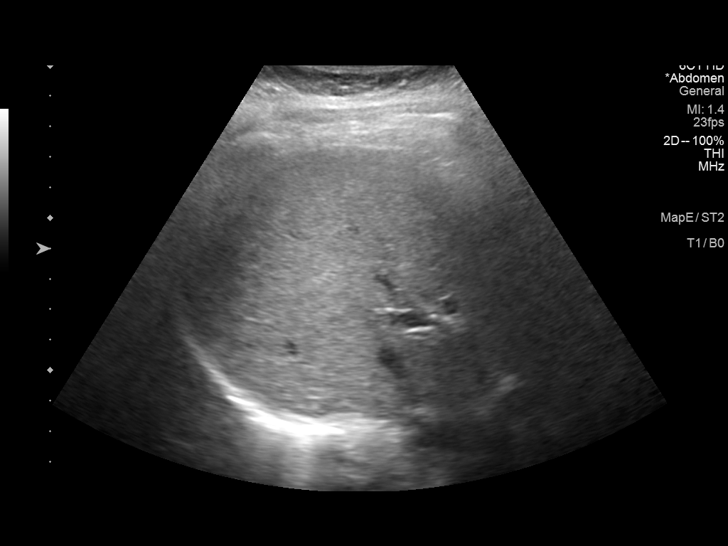

[14 of 25 positions shown; findings below may reference images not displayed]

FINDINGS: Gallbladder:

No gallstones or wall thickening visualized. No sonographic Murphy
sign noted by sonographer.

Common bile duct:

Diameter: 4 mm

Liver:

No focal lesion identified. Within normal limits in parenchymal
echogenicity. Portal vein is patent on color Doppler imaging with
normal direction of blood flow towards the liver.

Other: None.
IMPRESSION: No significant sonographic abnormality of the liver or gallbladder

## 2021-11-11 ENCOUNTER — Other Ambulatory Visit: Payer: Self-pay | Admitting: Nurse Practitioner

## 2021-11-11 DIAGNOSIS — B181 Chronic viral hepatitis B without delta-agent: Secondary | ICD-10-CM

## 2021-11-25 ENCOUNTER — Other Ambulatory Visit: Payer: BC Managed Care – PPO

## 2022-07-04 ENCOUNTER — Other Ambulatory Visit: Payer: Self-pay | Admitting: Nurse Practitioner

## 2022-07-04 DIAGNOSIS — K7402 Hepatic fibrosis, advanced fibrosis: Secondary | ICD-10-CM

## 2022-07-04 DIAGNOSIS — B181 Chronic viral hepatitis B without delta-agent: Secondary | ICD-10-CM

## 2022-10-13 ENCOUNTER — Ambulatory Visit
Admission: RE | Admit: 2022-10-13 | Discharge: 2022-10-13 | Disposition: A | Discharge status: Home / Self Care | Payer: PRIVATE HEALTH INSURANCE | Source: Ambulatory Visit | Attending: Nurse Practitioner | Admitting: Nurse Practitioner

## 2022-10-13 DIAGNOSIS — K7402 Hepatic fibrosis, advanced fibrosis: Secondary | ICD-10-CM

## 2022-10-13 DIAGNOSIS — B181 Chronic viral hepatitis B without delta-agent: Secondary | ICD-10-CM

## 2023-02-22 ENCOUNTER — Other Ambulatory Visit: Payer: Self-pay | Admitting: Nurse Practitioner

## 2023-02-22 DIAGNOSIS — B181 Chronic viral hepatitis B without delta-agent: Secondary | ICD-10-CM

## 2023-02-22 DIAGNOSIS — K7402 Hepatic fibrosis, advanced fibrosis: Secondary | ICD-10-CM

## 2023-03-02 ENCOUNTER — Other Ambulatory Visit: Payer: PRIVATE HEALTH INSURANCE

## 2023-04-04 ENCOUNTER — Ambulatory Visit
Admission: RE | Admit: 2023-04-04 | Discharge: 2023-04-04 | Disposition: A | Discharge status: Home / Self Care | Payer: PRIVATE HEALTH INSURANCE | Source: Ambulatory Visit | Attending: Nurse Practitioner | Admitting: Nurse Practitioner

## 2023-04-04 DIAGNOSIS — B181 Chronic viral hepatitis B without delta-agent: Secondary | ICD-10-CM

## 2023-04-04 DIAGNOSIS — K7402 Hepatic fibrosis, advanced fibrosis: Secondary | ICD-10-CM

## 2023-12-06 ENCOUNTER — Other Ambulatory Visit: Payer: Self-pay | Admitting: Nurse Practitioner

## 2023-12-06 DIAGNOSIS — B181 Chronic viral hepatitis B without delta-agent: Secondary | ICD-10-CM

## 2024-01-02 ENCOUNTER — Other Ambulatory Visit: Payer: PRIVATE HEALTH INSURANCE

## 2024-01-10 ENCOUNTER — Ambulatory Visit
Admission: RE | Admit: 2024-01-10 | Discharge: 2024-01-10 | Disposition: A | Discharge status: Home / Self Care | Payer: PRIVATE HEALTH INSURANCE | Source: Ambulatory Visit | Attending: Nurse Practitioner | Admitting: Nurse Practitioner

## 2024-01-10 DIAGNOSIS — B181 Chronic viral hepatitis B without delta-agent: Secondary | ICD-10-CM

## 2024-08-15 ENCOUNTER — Other Ambulatory Visit: Payer: Self-pay | Admitting: Nurse Practitioner

## 2024-08-15 DIAGNOSIS — K7402 Hepatic fibrosis, advanced fibrosis: Secondary | ICD-10-CM

## 2024-08-15 DIAGNOSIS — B181 Chronic viral hepatitis B without delta-agent: Secondary | ICD-10-CM

## 2024-10-10 ENCOUNTER — Ambulatory Visit
Admission: RE | Admit: 2024-10-10 | Discharge: 2024-10-10 | Disposition: A | Payer: PRIVATE HEALTH INSURANCE | Source: Ambulatory Visit | Attending: Nurse Practitioner

## 2024-10-10 DIAGNOSIS — B181 Chronic viral hepatitis B without delta-agent: Secondary | ICD-10-CM

## 2024-10-10 DIAGNOSIS — K7402 Hepatic fibrosis, advanced fibrosis: Secondary | ICD-10-CM
